# Patient Record
Sex: Female | Born: 1975 | Race: White | Hispanic: No | Smoking: Never smoker
Health system: Southern US, Community
[De-identification: ages and names within clinical notes are randomized; demographics above are authoritative.]

## PROBLEM LIST (undated history)

## (undated) DIAGNOSIS — K5909 Other constipation: Secondary | ICD-10-CM

## (undated) DIAGNOSIS — I471 Supraventricular tachycardia: Secondary | ICD-10-CM

## (undated) HISTORY — PX: TUBAL LIGATION: SHX77

## (undated) HISTORY — DX: Other constipation: K59.09

## (undated) HISTORY — DX: Supraventricular tachycardia: I47.1

---

## 1997-06-22 DIAGNOSIS — I471 Supraventricular tachycardia, unspecified: Secondary | ICD-10-CM

## 1997-06-22 HISTORY — DX: Supraventricular tachycardia: I47.1

## 1997-06-22 HISTORY — DX: Supraventricular tachycardia, unspecified: I47.10

## 1998-06-13 ENCOUNTER — Inpatient Hospital Stay (HOSPITAL_COMMUNITY): Admission: AD | Admit: 1998-06-13 | Discharge: 1998-06-13 | Payer: Self-pay | Admitting: Obstetrics & Gynecology

## 1998-06-25 ENCOUNTER — Encounter: Admission: RE | Admit: 1998-06-25 | Discharge: 1998-06-25 | Payer: Self-pay | Admitting: Obstetrics & Gynecology

## 1999-06-23 HISTORY — PX: TONSILLECTOMY: SHX5217

## 1999-10-08 ENCOUNTER — Emergency Department (HOSPITAL_COMMUNITY): Admission: EM | Admit: 1999-10-08 | Discharge: 1999-10-08 | Payer: Self-pay | Admitting: Emergency Medicine

## 1999-12-04 ENCOUNTER — Encounter: Payer: Self-pay | Admitting: *Deleted

## 1999-12-05 ENCOUNTER — Ambulatory Visit (HOSPITAL_COMMUNITY): Admission: RE | Admit: 1999-12-05 | Discharge: 1999-12-06 | Payer: Self-pay | Admitting: *Deleted

## 2001-08-05 ENCOUNTER — Emergency Department (HOSPITAL_COMMUNITY): Admission: EM | Admit: 2001-08-05 | Discharge: 2001-08-06 | Payer: Self-pay | Admitting: Emergency Medicine

## 2002-12-06 ENCOUNTER — Emergency Department (HOSPITAL_COMMUNITY): Admission: EM | Admit: 2002-12-06 | Discharge: 2002-12-06 | Payer: Self-pay | Admitting: Emergency Medicine

## 2012-11-28 ENCOUNTER — Ambulatory Visit: Payer: Self-pay | Admitting: Family Medicine

## 2012-12-06 ENCOUNTER — Ambulatory Visit: Payer: Self-pay | Admitting: Family Medicine

## 2013-02-01 ENCOUNTER — Ambulatory Visit (INDEPENDENT_AMBULATORY_CARE_PROVIDER_SITE_OTHER): Payer: 59 | Admitting: Physician Assistant

## 2013-02-01 ENCOUNTER — Encounter: Payer: Self-pay | Admitting: Physician Assistant

## 2013-02-01 VITALS — BP 104/72 | HR 76 | Temp 98.5°F | Resp 18 | Ht 68.5 in | Wt 146.0 lb

## 2013-02-01 DIAGNOSIS — K5909 Other constipation: Secondary | ICD-10-CM

## 2013-02-01 DIAGNOSIS — T7840XA Allergy, unspecified, initial encounter: Secondary | ICD-10-CM

## 2013-02-01 DIAGNOSIS — K900 Celiac disease: Secondary | ICD-10-CM

## 2013-02-01 DIAGNOSIS — N390 Urinary tract infection, site not specified: Secondary | ICD-10-CM

## 2013-02-01 DIAGNOSIS — K5900 Constipation, unspecified: Secondary | ICD-10-CM

## 2013-02-01 DIAGNOSIS — R3915 Urgency of urination: Secondary | ICD-10-CM

## 2013-02-01 LAB — URINALYSIS, ROUTINE W REFLEX MICROSCOPIC
Bilirubin Urine: NEGATIVE
Glucose, UA: NEGATIVE mg/dL
Hgb urine dipstick: NEGATIVE
Ketones, ur: NEGATIVE mg/dL
Nitrite: POSITIVE — AB
Protein, ur: NEGATIVE mg/dL
Specific Gravity, Urine: 1.02 (ref 1.005–1.030)
Urobilinogen, UA: 0.2 mg/dL (ref 0.0–1.0)
pH: 6.5 (ref 5.0–8.0)

## 2013-02-01 LAB — URINALYSIS, MICROSCOPIC ONLY
Casts: NONE SEEN
Crystals: NONE SEEN

## 2013-02-01 MED ORDER — LINACLOTIDE 145 MCG PO CAPS: 145.0000 ug | ORAL_CAPSULE | Freq: Every day | ORAL | Status: DC

## 2013-02-01 MED ORDER — NITROFURANTOIN MONOHYD MACRO 100 MG PO CAPS: 100.0000 mg | ORAL_CAPSULE | Freq: Two times a day (BID) | ORAL | Status: DC

## 2013-02-01 NOTE — Progress Notes (Signed)
Patient ID: Deanna Farrell MRN: [id number], DOB: 04-Nov-1975, 37 y.o. Date of Encounter: @DATE @  Chief Complaint:  Chief Complaint  Patient presents with  . new pt est care    also c/o UTI    HPI: 37 y.o. year old white female  presents as a new patient to our office to establish care here. She says that her mother is a patient here and her name is Marcelline Mates.  She has complaints of urinary urgency and malodorous urine. She said that she was having some dysuria but this is actually improved. She has seen no blood in his had no fevers or chills.  She reports a history of chronic constipation. She says that she tries to limit her laxative use she's afraid this will lead to intestinal problems.  As well she thinks that she definitetly has some type of gluten intolerance. She finds that when she eats bread this causes GI upset. At times when she has eaten a lot of bread she has even had some headache and joint pain.  She also reports that she knows she has some external hemorrhoids but says that these are not itchy or painful.  She reports her last physical was probably around 2007. As well she has had no GYN exam in years as well.   Past Medical History  Diagnosis Date  . SVT (supraventricular tachycardia) 1999  . Chronic constipation      Home Meds: See attached medication section for current medication list. Any medications entered into computer today will not appear on this note's list. The medications listed below were entered prior to today. No current outpatient prescriptions on file prior to visit.   No current facility-administered medications on file prior to visit.    Allergies: No Known Allergies  History   Social History  . Marital Status: Married    Spouse Name: N/A    Number of Children: N/A  . Years of Education: N/A   Occupational History  . [employer]    Social History Main Topics  . Smoking status: Never Smoker   . Smokeless tobacco: Never Used   . Alcohol Use: No  . Drug Use: No  . Sexual Activity: Not on file   Other Topics Concern  . Not on file   Social History Narrative   Single.    3 children. Ages 36, 36, 8.   Children with her full time.   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 Diabetes Mother   . Hypertension Father   . Hypertension Sister   . Diabetes Maternal Grandmother   . Cancer Maternal Grandmother 57    Lung Cancer--smoker  . Diabetes Paternal Grandmother   . Cancer Paternal Grandmother 87    Breast Cancer  . Stroke Paternal Grandmother   . Heart disease Paternal Grandmother      Review of Systems:  See HPI for pertinent ROS. All other ROS negative.    Physical Exam: Blood pressure 104/72, pulse 76, temperature 98.5 F (36.9 C), temperature source Oral, resp. rate 18, height 5' 8.5" (1.74 m), weight 146 lb (66.225 kg), last menstrual period 01/24/2013., Body mass index is 21.87 kg/(m^2). General: Well-nourished well-developed white female Appears in no acute distress. Neck: Supple. No thyromegaly. No lymphadenopathy. Lungs: Clear bilaterally to auscultation without wheezes, rales, or rhonchi. Breathing is unlabored. Heart: RRR with S1 S2. No murmurs, rubs, or gallops. Abdomen: Soft, non-tender, non-distended with normoactive bowel sounds.  No hepatomegaly. No rebound/guarding. No obvious abdominal masses. Musculoskeletal:  Strength and tone normal for age. Extremities/Skin: Warm and dry.  No edema.  Neuro: Alert and oriented X 3. Moves all extremities spontaneously. Gait is normal. CNII-XII grossly in tact. Psych:  Responds to questions appropriately with a normal affect.   Results for orders placed in visit on 02/01/13  URINALYSIS, ROUTINE W REFLEX MICROSCOPIC      Result Value Range   Color, Urine YELLOW  YELLOW   APPearance CLOUDY (*) CLEAR   Specific Gravity, Urine 1.020  1.005 - 1.030   pH 6.5  5.0 - 8.0   Glucose, UA NEG  NEG  mg/dL   Bilirubin Urine NEG  NEG   Ketones, ur NEG  NEG mg/dL   Hgb urine dipstick NEG  NEG   Protein, ur NEG  NEG mg/dL   Urobilinogen, UA 0.2  0.0 - 1.0 mg/dL   Nitrite POS (*) NEG   Leukocytes, UA TRACE (*) NEG  URINALYSIS, MICROSCOPIC ONLY      Result Value Range   Squamous Epithelial / LPF RARE  RARE   Crystals NONE SEEN  NONE SEEN   Casts NONE SEEN  NONE SEEN   WBC, UA 3-6 (*) <3 WBC/hpf   RBC / HPF 0-2  <3 RBC/hpf   Bacteria, UA MANY (*) RARE     ASSESSMENT AND PLAN:  37 y.o. year old female with  1. Urgency of urination - Urinalysis, Routine w reflex microscopic  2. UTI (urinary tract infection) - nitrofurantoin, macrocrystal-monohydrate, (MACROBID) 100 MG capsule; Take 1 capsule (100 mg total) by mouth 2 (two) times daily.  Dispense: 6 capsule; Refill: 0  3.  gluten sensitivity Will obtain Celiac Panel, CMET, TSH, CBC to further evaluate. - Celiac panel 10 - CBC with Differential - COMPLETE METABOLIC PANEL WITH GFR  4. Chronic constipation - Linaclotide (LINZESS) 145 MCG CAPS capsule; Take 1 capsule (145 mcg total) by mouth daily.  Dispense: 30 capsule; Refill: 3  She is to schedule a complete physical exam for an early morning appointment so she can come fasting and check fasting labs at the same time as the visits.  Murray Hodgkins [place], [place], [facility] 02/01/2013 3:25 PM

## 2013-02-02 LAB — CBC WITH DIFFERENTIAL/PLATELET
Basophils Absolute: 0.1 10*3/uL (ref 0.0–0.1)
Basophils Relative: 1 % (ref 0–1)
Eosinophils Absolute: 0.2 10*3/uL (ref 0.0–0.7)
Eosinophils Relative: 3 % (ref 0–5)
HCT: 40.3 % (ref 36.0–46.0)
Hemoglobin: 13.4 g/dL (ref 12.0–15.0)
Lymphocytes Relative: 22 % (ref 12–46)
Lymphs Abs: 2.1 10*3/uL (ref 0.7–4.0)
MCH: 26.9 pg (ref 26.0–34.0)
MCHC: 33.3 g/dL (ref 30.0–36.0)
MCV: 80.9 fL (ref 78.0–100.0)
Monocytes Absolute: 0.7 10*3/uL (ref 0.1–1.0)
Monocytes Relative: 7 % (ref 3–12)
Neutro Abs: 6.5 10*3/uL (ref 1.7–7.7)
Neutrophils Relative %: 67 % (ref 43–77)
Platelets: 208 10*3/uL (ref 150–400)
RBC: 4.98 MIL/uL (ref 3.87–5.11)
RDW: 13.9 % (ref 11.5–15.5)
WBC: 9.5 10*3/uL (ref 4.0–10.5)

## 2013-02-02 LAB — COMPLETE METABOLIC PANEL WITH GFR
ALT: 13 U/L (ref 0–35)
AST: 13 U/L (ref 0–37)
Albumin: 4.4 g/dL (ref 3.5–5.2)
Alkaline Phosphatase: 66 U/L (ref 39–117)
BUN: 12 mg/dL (ref 6–23)
CO2: 26 mEq/L (ref 19–32)
Calcium: 9.3 mg/dL (ref 8.4–10.5)
Chloride: 104 mEq/L (ref 96–112)
Creat: 0.77 mg/dL (ref 0.50–1.10)
GFR, Est African American: >89 mL/min
GFR, Est Non African American: >89 mL/min
Glucose, Bld: 76 mg/dL (ref 70–99)
Potassium: 3.9 mEq/L (ref 3.5–5.3)
Sodium: 137 mEq/L (ref 135–145)
Total Bilirubin: 0.3 mg/dL (ref 0.3–1.2)
Total Protein: 7.2 g/dL (ref 6.0–8.3)

## 2013-02-02 LAB — CELIAC PANEL 10
Endomysial Screen: NEGATIVE
Gliadin IgA: 2 U/mL (ref <20)
Gliadin IgG: 5.8 U/mL (ref <20)
IgA: 192 mg/dL (ref 69–380)
Tissue Transglut Ab: 8.7 U/mL (ref <20)
Tissue Transglutaminase Ab, IgA: 2.1 U/mL (ref <20)

## 2013-02-02 LAB — TSH: TSH: 2.833 u[IU]/mL (ref 0.350–4.500)

## 2013-03-09 ENCOUNTER — Encounter: Payer: Self-pay | Admitting: Physician Assistant

## 2013-03-09 ENCOUNTER — Ambulatory Visit (INDEPENDENT_AMBULATORY_CARE_PROVIDER_SITE_OTHER): Payer: 59 | Admitting: Physician Assistant

## 2013-03-09 VITALS — BP 100/70 | HR 82 | Temp 97.5°F | Resp 20 | Ht 67.0 in | Wt 144.0 lb

## 2013-03-09 DIAGNOSIS — K900 Celiac disease: Secondary | ICD-10-CM

## 2013-03-09 DIAGNOSIS — Z0000 Encounter for general adult medical examination without abnormal findings: Secondary | ICD-10-CM

## 2013-03-09 DIAGNOSIS — K5909 Other constipation: Secondary | ICD-10-CM

## 2013-03-09 DIAGNOSIS — K5900 Constipation, unspecified: Secondary | ICD-10-CM

## 2013-03-09 LAB — LIPID PANEL
Cholesterol: 157 mg/dL (ref 0–200)
HDL: 50 mg/dL (ref >39)
LDL Cholesterol: 95 mg/dL (ref 0–99)
Total CHOL/HDL Ratio: 3.1 Ratio
Triglycerides: 62 mg/dL (ref <150)
VLDL: 12 mg/dL (ref 0–40)

## 2013-03-09 NOTE — Progress Notes (Signed)
Patient ID: Deanna Farrell MRN: [id number], DOB: 07-28-1975, 37 y.o. Date of Encounter: 03/09/2013,   Chief Complaint: Physical (CPE)  HPI: 37 y.o. y/o white female  here for CPE.   I saw her as a new patient to our office to establish care here on 02/02/13.  At that time she reported a history of chronic constipation. She reported she tried to limit her laxative use because she was afraid that would lead to intestinal problems. At that visit I prescribed Linzess. She says that this definitely helps. However she reports that she has a hard time remembering to take it every single day. She is a single mother with 3 children ages 36, 22, and 32. The children are with her full time. She works from 7:30 AM to about 6 PM. She says that the older son helps with transporting the siblings in the afternoon. In the morning she has to get them all to school. She says that she keeps the medicine bottle in her purse and doesnot always remember to take it when she gets to work. She says that when she does take it routinely but it definitely does work well.  However she says that even with the linzess, she still feels that there is some type of problem. She says it feels as if there is some type of pouch where stool is getting stuck or some type of obstruction. She is still concerned about this.  Also at her visit with me on 02/02/13 she said that she felt that she had some type of gluten intolerance. She reported that when she ate bread this caused GI upset. At that office visit we obtained labs including a celiac panel and thyroid function CBC and CMET. All labs were normal including a celiac panel.  However she continues to report the same symptoms with GI upset anytime she eats bread products.  She has no other complaints. She has had no GYN exam in several years. However she does not want to do GYN exam today and says that she needs to hurry to get back to work.   Review of Systems: Consitutional: No fever,  chills, fatigue, night sweats, lymphadenopathy. No significant/unexplained weight changes. Eyes: No visual changes, eye redness, or discharge. ENT/Mouth: No ear pain, sore throat, nasal drainage, or sinus pain. Cardiovascular: No chest pressure,heaviness, tightness or squeezing, even with exertion. No increased shortness of breath or dyspnea on exertion.No palpitations, edema, orthopnea, PND. Respiratory: No cough, hemoptysis, SOB, or wheezing. Gastrointestinal: No anorexia, dysphagia, reflux, pain, nausea, vomiting, hematemesis, diarrhea,  BRBPR, or melena. Breast: No mass, nodules, bulging, or retraction. No skin changes or inflammation. No nipple discharge. No lymphadenopathy. Genitourinary: No dysuria, hematuria, incontinence, vaginal discharge, pruritis, burning, abnormal bleeding, or pain. Musculoskeletal: No decreased ROM, No joint pain or swelling. No significant pain in neck, back, or extremities. Skin: No rash, pruritis, or concerning lesions. Neurological: No headache, dizziness, syncope, seizures, tremors, memory loss, coordination problems, or paresthesias. Psychological: No anxiety, depression, hallucinations, SI/HI. Endocrine: No polydipsia, polyphagia, polyuria, or known diabetes.No increased fatigue. No palpitations/rapid heart rate. No significant/unexplained weight change. All other systems were reviewed and are otherwise negative.  Past Medical History  Diagnosis Date  . SVT (supraventricular tachycardia) 1999  . Chronic constipation      Past Surgical History  Procedure Laterality Date  . Tonsillectomy  2001    Home Meds:  Current Outpatient Prescriptions on File Prior to Visit  Medication Sig Dispense Refill  . Linaclotide (LINZESS) 145 MCG CAPS  capsule Take 1 capsule (145 mcg total) by mouth daily.  30 capsule  3   No current facility-administered medications on file prior to visit.    Allergies: No Known Allergies  History   Social History  . Marital  Status:  single     Spouse Name: N/A    Number of Children: N/A  . Years of Education: N/A   Occupational History  . [employer]    Social History Main Topics  . Smoking status: Never Smoker   . Smokeless tobacco: Never Used  . Alcohol Use: No  . Drug Use: No  . Sexual Activity: Not on file   Other Topics Concern  . Not on file   Social History Narrative   Single.    3 children. Ages 108, 36, 8.   Children with her full time.   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 Diabetes Mother   . Hypertension Father   . Hypertension Sister   . Diabetes Maternal Grandmother   . Cancer Maternal Grandmother 109    Lung Cancer--smoker  . Diabetes Paternal Grandmother   . Cancer Paternal Grandmother 69    Breast Cancer  . Stroke Paternal Grandmother   . Heart disease Paternal Grandmother     Physical Exam: Blood pressure 100/70, pulse 82, temperature 97.5 F (36.4 C), temperature source Oral, resp. rate 20, height 5\' 7"  (1.702 m), weight 144 lb (65.318 kg), last menstrual period 02/24/2013., Body mass index is 22.55 kg/(m^2). General: Well developed, well nourished, white female.  in no acute distress. HEENT: Normocephalic, atraumatic. Conjunctiva pink, sclera non-icteric. Pupils 2 mm constricting to 1 mm, round, regular, and equally reactive to light and accomodation. EOMI. Internal auditory canal clear. TMs with good cone of light and without pathology. Nasal mucosa pink. Nares are without discharge. No sinus tenderness. Oral mucosa pink.  Pharynx without exudate.   Neck: Supple. Trachea midline. No thyromegaly. Full ROM. No lymphadenopathy.No Carotid Bruits. Lungs: Clear to auscultation bilaterally without wheezes, rales, or rhonchi. Breathing is of normal effort and unlabored. Cardiovascular: RRR with S1 S2. No murmurs, rubs, or gallops. Distal pulses 2+ symmetrically. No carotid or abdominal bruits. Breast: Deferred  by pt today. Abdomen: Soft, non-tender, non-distended with normoactive bowel sounds. No hepatosplenomegaly or masses. No rebound/guarding. No CVA tenderness. No hernias.  Genitourinary:  Deferred by pt today Musculoskeletal: Full range of motion and 5/5 strength throughout. Without swelling, atrophy, tenderness, crepitus, or warmth. Extremities without clubbing, cyanosis, or edema. Calves supple. Skin: Warm and moist without erythema, ecchymosis, wounds, or rash. Neuro: A+Ox3. CN II-XII grossly intact. Moves all extremities spontaneously. Full sensation throughout. Normal gait. DTR 2+ throughout upper and lower extremities. Finger to nose intact. Psych:  Responds to questions appropriately with a normal affect.   Assessment/Plan:  37 y.o. y/o female here for CPE 1. Visit for preventive health examination 8/14 we already did a celiac panel, CBC, CMP T., TSH. All were normal. We'll check other labs now. - Lipid panel - Vit D  25 hydroxy (rtn osteoporosis monitoring)  2. Chronic constipation I recommended that she put her lens as with her hair brush her her toothbrush. Keep some small Dixie cups with it. Take it every morning when she brushes her hair brushes her teeth. Given her other symptoms it sounds as she may need colonoscopy. Discussed this with her and she is agreeable for me to proceed with referral to GI. - Ambulatory referral  to Gastroenterology  3. Transient gluten sensitivity Celiac panel was negative. However she clearly has GI symptoms anytime she eats these products. Continue to avoid these products and can further evaluate with.GI - Ambulatory referral to Gastroenterology   A. Screening Labs: See above  B. Pap: Patient says she needs to hurry to go back to work today. She will reschedule to come in to do GYN exam.   F. Immunizations:  Influenza: Discuss getting flu vaccine today. She defers. Tetanus: She thinks her tetanus is up to date defers getting this  today.  Signed, [address] [place], [place], [facility] 03/09/2013 9:08 AM

## 2013-03-10 ENCOUNTER — Encounter: Payer: Self-pay | Admitting: Gastroenterology

## 2013-03-10 LAB — VITAMIN D 25 HYDROXY (VIT D DEFICIENCY, FRACTURES): Vit D, 25-Hydroxy: 26 ng/mL — ABNORMAL LOW (ref 30–89)

## 2013-03-20 ENCOUNTER — Encounter: Payer: Self-pay | Admitting: Family Medicine

## 2013-04-03 ENCOUNTER — Telehealth: Payer: Self-pay | Admitting: Gastroenterology

## 2013-04-03 ENCOUNTER — Encounter: Payer: Self-pay | Admitting: Gastroenterology

## 2013-04-03 ENCOUNTER — Ambulatory Visit: Payer: Self-pay | Admitting: Gastroenterology

## 2013-04-03 NOTE — Telephone Encounter (Signed)
MAILED LETTER °

## 2013-04-03 NOTE — Telephone Encounter (Signed)
Pt was a no show

## 2013-04-04 ENCOUNTER — Encounter: Payer: Self-pay | Admitting: Gastroenterology

## 2013-04-08 ENCOUNTER — Encounter (HOSPITAL_COMMUNITY): Payer: Self-pay | Admitting: Emergency Medicine

## 2013-04-08 ENCOUNTER — Emergency Department (HOSPITAL_COMMUNITY)
Admission: EM | Admit: 2013-04-08 | Discharge: 2013-04-08 | Disposition: A | Discharge status: Home / Self Care | Payer: 59 | Attending: Emergency Medicine | Admitting: Emergency Medicine

## 2013-04-08 DIAGNOSIS — Z79899 Other long term (current) drug therapy: Secondary | ICD-10-CM | POA: Insufficient documentation

## 2013-04-08 DIAGNOSIS — R51 Headache: Secondary | ICD-10-CM | POA: Insufficient documentation

## 2013-04-08 DIAGNOSIS — K5900 Constipation, unspecified: Secondary | ICD-10-CM | POA: Insufficient documentation

## 2013-04-08 DIAGNOSIS — IMO0001 Reserved for inherently not codable concepts without codable children: Secondary | ICD-10-CM | POA: Insufficient documentation

## 2013-04-08 DIAGNOSIS — J020 Streptococcal pharyngitis: Secondary | ICD-10-CM | POA: Insufficient documentation

## 2013-04-08 DIAGNOSIS — R509 Fever, unspecified: Secondary | ICD-10-CM | POA: Insufficient documentation

## 2013-04-08 DIAGNOSIS — Z8679 Personal history of other diseases of the circulatory system: Secondary | ICD-10-CM | POA: Insufficient documentation

## 2013-04-08 LAB — RAPID STREP SCREEN (MED CTR MEBANE ONLY): Streptococcus, Group A Screen (Direct): POSITIVE — AB

## 2013-04-08 MED ORDER — IBUPROFEN 800 MG PO TABS
800.0000 mg | ORAL_TABLET | Freq: Once | ORAL | Status: AC
  Administered 2013-04-08: 800 mg via ORAL
  Filled 2013-04-08: qty 1

## 2013-04-08 MED ORDER — PENICILLIN V POTASSIUM 250 MG PO TABS: 250.0000 mg | ORAL_TABLET | Freq: Four times a day (QID) | ORAL | Status: AC

## 2013-04-08 NOTE — ED Notes (Signed)
Pt presents with sore throat, body aches, and fever since last night. NAD noted. Pt reports has a history of repeat strep throat.

## 2013-04-08 NOTE — ED Provider Notes (Signed)
Medical screening examination/treatment/procedure(s) were performed by non-physician practitioner and as supervising physician I was immediately available for consultation/collaboration. Kenli Waldo, MD, FACEP   Jedaiah Rathbun L Anastasios Melander, MD 04/08/13 1615 

## 2013-04-08 NOTE — ED Provider Notes (Signed)
CSN: [id number]     Arrival date & time 04/08/13  1243 History   First MD Initiated Contact with Patient 04/08/13 1252     Chief Complaint  Patient presents with  . Sore Throat   (Consider location/radiation/quality/duration/timing/severity/associated sxs/prior Treatment) Patient is a 37 y.o. female presenting with pharyngitis.  Sore Throat This is a new problem. The current episode started yesterday. The problem occurs constantly. The problem has been gradually worsening. Associated symptoms include chills, a fever, headaches, myalgias, a sore throat and swollen glands. Pertinent negatives include no abdominal pain, nausea, rash or vomiting. The symptoms are aggravated by eating and swallowing. She has tried acetaminophen for the symptoms. The treatment provided mild relief.   Deanna Farrell is a 36 y.o. female who presents to the ED with sore throat and fever that started last night. She has had strep in the past and this feels the same.   Past Medical History  Diagnosis Date  . SVT (supraventricular tachycardia) 1999  . Chronic constipation    Past Surgical History  Procedure Laterality Date  . Tonsillectomy  2001  . Tubal ligation     Family History  Problem Relation Age of Onset  . Diabetes Mother   . Hypertension Father   . Hypertension Sister   . Diabetes Maternal Grandmother   . Cancer Maternal Grandmother 58    Lung Cancer--smoker  . Diabetes Paternal Grandmother   . Cancer Paternal Grandmother 65    Breast Cancer  . Stroke Paternal Grandmother   . Heart disease Paternal Grandmother    History  Substance Use Topics  . Smoking status: Never Smoker   . Smokeless tobacco: Never Used  . Alcohol Use: No   OB History   Grav Para Term Preterm Abortions TAB SAB Ect Mult Living                 Review of Systems  Constitutional: Positive for fever and chills.  HENT: Positive for sore throat. Negative for trouble swallowing.   Eyes: Negative for visual disturbance.   Gastrointestinal: Negative for nausea, vomiting and abdominal pain.  Genitourinary: Negative for dysuria.  Musculoskeletal: Positive for myalgias.  Skin: Negative for rash.  Neurological: Positive for headaches.  Psychiatric/Behavioral: The patient is not nervous/anxious.     Allergies  Review of patient's allergies indicates no known allergies.  Home Medications   Current Outpatient Rx  Name  Route  Sig  Dispense  Refill  . Linaclotide (LINZESS) 145 MCG CAPS capsule   Oral   Take 1 capsule (145 mcg total) by mouth daily.   30 capsule   3    BP 111/66  Pulse 90  Temp(Src) 100 F (37.8 C) (Oral)  Ht 5\' 7"  (1.702 m)  Wt 145 lb (65.772 kg)  BMI 22.71 kg/m2  SpO2 100%  LMP 03/12/2013 Physical Exam  Nursing note and vitals reviewed. Constitutional: She is oriented to person, place, and time. She appears well-developed and well-nourished. No distress.  HENT:  Head: Normocephalic.  Mouth/Throat: Uvula is midline and mucous membranes are normal. Posterior oropharyngeal erythema present.  Eyes: EOM are normal.  Neck: Normal range of motion. Neck supple.  Cardiovascular: Normal rate and regular rhythm.   Pulmonary/Chest: Effort normal and breath sounds normal.  Abdominal: Soft. Bowel sounds are normal. There is no tenderness.  Musculoskeletal: Normal range of motion.  Lymphadenopathy:    She has cervical adenopathy.  Neurological: She is alert and oriented to person, place, and time. No cranial nerve deficit.  Skin: Skin is warm and dry.  Psychiatric: She has a normal mood and affect. Her behavior is normal.   Results for orders placed during the hospital encounter of 04/08/13 (from the past 24 hour(s))  RAPID STREP SCREEN     Status: Abnormal   Collection Time    04/08/13 12:59 PM      Result Value Range   Streptococcus, Group A Screen (Direct) POSITIVE (*) NEGATIVE    ED Course  Procedures  MDM  37 y.o. female with strep pharyngitis. Will treat with antibiotics  and she will follow up with her PCP or return here as needed.  Discussed with the patient and all questioned fully answered.   Medication List    TAKE these medications       penicillin v potassium 250 MG tablet  Commonly known as:  VEETID  Take 1 tablet (250 mg total) by mouth 4 (four) times daily.      ASK your doctor about these medications       Linaclotide 145 MCG Caps capsule  Commonly known as:  LINZESS  Take 1 capsule (145 mcg total) by mouth daily.           Janne Napoleon, NP 04/08/13 1322

## 2013-04-12 ENCOUNTER — Other Ambulatory Visit: Payer: 59 | Admitting: Physician Assistant

## 2013-04-24 ENCOUNTER — Ambulatory Visit (INDEPENDENT_AMBULATORY_CARE_PROVIDER_SITE_OTHER): Payer: 59 | Admitting: Gastroenterology

## 2013-04-24 ENCOUNTER — Other Ambulatory Visit: Payer: Self-pay | Admitting: Gastroenterology

## 2013-04-24 ENCOUNTER — Encounter: Payer: Self-pay | Admitting: Gastroenterology

## 2013-04-24 VITALS — BP 114/71 | HR 70 | Temp 97.3°F | Wt 148.2 lb

## 2013-04-24 DIAGNOSIS — K5900 Constipation, unspecified: Secondary | ICD-10-CM

## 2013-04-24 DIAGNOSIS — K5909 Other constipation: Secondary | ICD-10-CM

## 2013-04-24 MED ORDER — PEG 3350-KCL-NA BICARB-NACL 420 G PO SOLR: 4000.0000 mL | ORAL | Status: DC

## 2013-04-24 MED ORDER — LINACLOTIDE 290 MCG PO CAPS: 290.0000 ug | ORAL_CAPSULE | Freq: Every day | ORAL | Status: DC

## 2013-04-24 NOTE — Progress Notes (Signed)
Referring Provider: Deon Pilling Primary Care Physician:  Leo Grosser, MD Primary Gastroenterologist:  Dr. Darrick Penna   Chief Complaint  Patient presents with  . Constipation  . Hemorrhoids    HPI:   Deanna Farrell presents today at the request of Allayne Butcher, PA, secondary to chronic constipation. Negative celiac serologies. Bread worsens constipation. Feels like there is an area in her digestive tract where it feels like things "stop moving". Serious hemorrhoid issues. Worried about internal hemorrhoids, diverticulosis. Will have urge to have a BM but non-productive. Constipation since having children. Chronic. No rectal bleeding. Worsening over time. Immediately feels bloated with bread. Feels joint pain, headaches, achy feeling coming on. Area lower abdomen that feels painful/pressure, but the stool will not pass. Having difficulty with diet modification due to strenuous work-life. Off of Linzess now, copay 30 $. Cost is an issue at times. Has to manually reduce hemorrhoids. Rectal discomfort. No other concerning GI symptoms.   Past Medical History  Diagnosis Date  . SVT (supraventricular tachycardia) 1999  . Chronic constipation     Past Surgical History  Procedure Laterality Date  . Tonsillectomy  2001  . Tubal ligation      Current Outpatient Prescriptions  Medication Sig Dispense Refill  . Linaclotide (LINZESS) 290 MCG CAPS capsule Take 1 capsule (290 mcg total) by mouth daily.  30 capsule  3  . polyethylene glycol-electrolytes (TRILYTE) 420 G solution Take 4,000 mLs by mouth as directed.  4000 mL  0   No current facility-administered medications for this visit.    Allergies as of 04/24/2013  . (No Known Allergies)    Family History  Problem Relation Age of Onset  . Diabetes Mother   . Hypertension Father   . Hypertension Sister   . Diabetes Maternal Grandmother   . Cancer Maternal Grandmother 52    Lung Cancer--smoker  . Diabetes Paternal Grandmother    . Cancer Paternal Grandmother 23    Breast Cancer  . Stroke Paternal Grandmother   . Heart disease Paternal Grandmother   . Colon cancer Neg Hx     History   Social History  . Marital Status: Divorced    Spouse Name: N/A    Number of Children: N/A  . Years of Education: N/A   Occupational History  . [employer]    Social History Main Topics  . Smoking status: Never Smoker   . Smokeless tobacco: Never Used  . Alcohol Use: No  . Drug Use: No  . Sexual Activity: Yes    Birth Control/ Protection: Surgical   Other Topics Concern  . Not on file   Social History Narrative   Single.    3 children. Ages 35, 36,35 . 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    Children with her full time.   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   Physical Exam: BP 114/71  Pulse 70  Temp(Src) 97.3 F (36.3 C) (Oral)  Wt 148 lb 3.2 oz (67.223 kg)  LMP 04/24/2013 General:   Alert and oriented. Well-developed, well-nourished, pleasant and cooperative. Head:  Normocephalic and atraumatic. Eyes:  Conjunctiva pink, sclera clear, no icterus.   Conjunctiva pink. Ears:  Normal auditory acuity. Nose:  No deformity, discharge,  or lesions. Mouth:  No deformity or lesions, mucosa pink and moist.  Neck:  Supple, without mass or thyromegaly. Lungs:  Clear to auscultation bilaterally, without wheezing, rales, or rhonchi.  Heart:  S1, S2 present without murmurs  noted.  Abdomen:  +BS, soft, non-tender and non-distended. Without mass or HSM. No rebound or guarding. No hernias noted. Rectal:  Deferred  Msk:  Symmetrical without gross deformities. Normal posture. Extremities:  Without clubbing or edema. Neurologic:  Alert and  oriented x4;  grossly normal neurologically. Skin:  Intact, warm and dry without significant lesions or rashes Cervical Nodes:  No significant cervical adenopathy. Psych:  Alert and cooperative. Normal mood and affect.

## 2013-04-24 NOTE — Patient Instructions (Signed)
Start taking Linzess 290 mcg once each morning on an empty stomach. We have provided a voucher for you to get a free month supply.  We have scheduled you for a colonoscopy with Dr. Darrick Penna in the near future.

## 2013-04-26 NOTE — Assessment & Plan Note (Signed)
37 year old female with chronic constipation now worsening, with urges at time to defecate without production. Notes difficulty passing stool, with what sounds like possible element of pelvic floor dysfunction. No rectal bleeding noted. Increase Linzess to 290 mcg and proceed with colonoscopy with Dr. Darrick Penna due to worsening constipation despite medication. Risks and benefits discussed, with stated understanding.

## 2013-05-01 NOTE — Progress Notes (Signed)
cc'd to pcp 

## 2013-05-11 ENCOUNTER — Other Ambulatory Visit: Payer: 59 | Admitting: Physician Assistant

## 2013-05-17 ENCOUNTER — Encounter (HOSPITAL_COMMUNITY): Payer: Self-pay

## 2013-05-29 ENCOUNTER — Encounter (HOSPITAL_COMMUNITY): Payer: Self-pay | Admitting: *Deleted

## 2013-05-29 ENCOUNTER — Other Ambulatory Visit: Payer: Self-pay | Admitting: Gastroenterology

## 2013-05-29 ENCOUNTER — Ambulatory Visit (HOSPITAL_COMMUNITY)
Admission: RE | Admit: 2013-05-29 | Discharge: 2013-05-29 | Disposition: A | Discharge status: Home / Self Care | Payer: 59 | Source: Ambulatory Visit | Attending: Gastroenterology | Admitting: Gastroenterology

## 2013-05-29 ENCOUNTER — Encounter (HOSPITAL_COMMUNITY)
Admission: RE | Disposition: A | Discharge status: Home / Self Care | Payer: Self-pay | Source: Ambulatory Visit | Attending: Gastroenterology

## 2013-05-29 DIAGNOSIS — N816 Rectocele: Secondary | ICD-10-CM

## 2013-05-29 DIAGNOSIS — K644 Residual hemorrhoidal skin tags: Secondary | ICD-10-CM

## 2013-05-29 DIAGNOSIS — K648 Other hemorrhoids: Secondary | ICD-10-CM

## 2013-05-29 DIAGNOSIS — M6289 Other specified disorders of muscle: Secondary | ICD-10-CM

## 2013-05-29 DIAGNOSIS — R198 Other specified symptoms and signs involving the digestive system and abdomen: Secondary | ICD-10-CM | POA: Insufficient documentation

## 2013-05-29 DIAGNOSIS — Z01419 Encounter for gynecological examination (general) (routine) without abnormal findings: Secondary | ICD-10-CM

## 2013-05-29 DIAGNOSIS — K5900 Constipation, unspecified: Secondary | ICD-10-CM

## 2013-05-29 HISTORY — PX: COLONOSCOPY: SHX5424

## 2013-05-29 SURGERY — COLONOSCOPY
Anesthesia: Moderate Sedation

## 2013-05-29 MED ORDER — SIMETHICONE 40 MG/0.6ML PO SUSP
ORAL | Status: DC | PRN
  Administered 2013-05-29: 09:00:00

## 2013-05-29 MED ORDER — MIDAZOLAM HCL 5 MG/5ML IJ SOLN
INTRAMUSCULAR | Status: DC | PRN
  Administered 2013-05-29: 1 mg via INTRAVENOUS
  Administered 2013-05-29 (×3): 2 mg via INTRAVENOUS

## 2013-05-29 MED ORDER — MEPERIDINE HCL 100 MG/ML IJ SOLN
INTRAMUSCULAR | Status: AC
  Filled 2013-05-29: qty 2

## 2013-05-29 MED ORDER — SODIUM CHLORIDE 0.9 % IV SOLN
INTRAVENOUS | Status: DC
  Administered 2013-05-29: 08:00:00 via INTRAVENOUS

## 2013-05-29 MED ORDER — MIDAZOLAM HCL 5 MG/5ML IJ SOLN
INTRAMUSCULAR | Status: AC
  Filled 2013-05-29: qty 10

## 2013-05-29 MED ORDER — MEPERIDINE HCL 100 MG/ML IJ SOLN
INTRAMUSCULAR | Status: DC | PRN
  Administered 2013-05-29 (×3): 25 mg via INTRAVENOUS

## 2013-05-29 NOTE — Op Note (Signed)
[facility] [address] [place], [address]   COLONOSCOPY PROCEDURE REPORT  PATIENT: Deanna Farrell, Deanna Farrell  MR#: [id number] BIRTHDATE: 11/26/1975 , 37  yrs. old GENDER: Female ENDOSCOPIST: Jonette Eva, MD REFERRED XB:JYNWGN Tanya Nones, M.D. PROCEDURE DATE:  05/29/2013 PROCEDURE:   Colonoscopy, diagnostic INDICATIONS:Change in bowel habits. MEDICATIONS: Demerol 75 mg IV and Versed 7 mg IV  DESCRIPTION OF PROCEDURE:    Physical exam was performed.  Informed consent was obtained from the patient after explaining the benefits, risks, and alternatives to procedure.  The patient was connected to monitor and placed in left lateral position. Continuous oxygen was provided by nasal cannula and IV medicine administered through an indwelling cannula.  After administration of sedation and rectal exam, the patients rectum was intubated and the EC-[id number] ([id number])  colonoscope was advanced under direct visualization to the ileum.  The scope was removed slowly by carefully examining the color, texture, anatomy, and integrity mucosa on the way out.  The patient was recovered in endoscopy and discharged home in satisfactory condition.    COLON FINDINGS: The mucosa appeared normal in the terminal ileum.  , The colon was redundant.  Manual abdominal counter-pressure was used to reach the cecum.  The patient was moved on to their back to reach the cecum, The colon mucosa was otherwise normal.  , and Moderate sized internal and external hemorrhoids were found.  PREP QUALITY: good.  CECAL W/D TIME: 10 minutes     COMPLICATIONS: None  ENDOSCOPIC IMPRESSION: 1.   Normal mucosa in the terminal ileum 2.   The colon was redundant 3.   Moderate sized internal and external hemorrhoids  RECOMMENDATIONS: CONTINUE LINZESS. SEE [facility] TO EVALUATE FOR RECTOCELE/PAP/PELVIC EXAM. DRINK WATER TO KEEP YOUR URINE LIGHT YELLOW. FOLLOW A HIGH FIBER DIET.  AVOID ITEMS THAT CAUSE  BLOATING. USE PREPARATION H 2 TO 4 TIMES A DAY AS NEEDED FOR RECTAL PRESSURE/PAIN/BLEEDING. OPV IN APR 2014.  Next colonoscopy AT AGE 35.       _______________________________ eSignedJonette Eva, MD 05/29/2013 9:22 AM

## 2013-05-29 NOTE — Progress Notes (Signed)
Referral has been made to [facility] 

## 2013-05-29 NOTE — Progress Notes (Signed)
REFER TO [facility] FOR CONSTIPATION-EVALUATE FOR PELVIC FLOOR DYSFUNCTION/RECTOCELE/PAP/PELVIC EXAM.

## 2013-05-29 NOTE — H&P (Signed)
  Primary Care Physician:  Leo Grosser, MD Primary Gastroenterologist:  Dr. Darrick Penna  Pre-Procedure History & Physical: HPI:  Deanna Farrell is a 37 y.o. female here for CHANGE IN BOWEL HABITs.Marland Kitchen   Past Medical History  Diagnosis Date  . SVT (supraventricular tachycardia) 1999  . Chronic constipation     Past Surgical History  Procedure Laterality Date  . Tonsillectomy  2001  . Tubal ligation      Prior to Admission medications   Medication Sig Start Date End Date Taking? Authorizing Provider  Linaclotide (LINZESS) 290 MCG CAPS capsule Take 1 capsule (290 mcg total) by mouth daily. 04/24/13  Yes Nira Retort, NP    Allergies as of 04/24/2013  . (No Known Allergies)    Family History  Problem Relation Age of Onset  . Diabetes Mother   . Hypertension Father   . Hypertension Sister   . Diabetes Maternal Grandmother   . Cancer Maternal Grandmother 48    Lung Cancer--smoker  . Diabetes Paternal Grandmother   . Cancer Paternal Grandmother 48    Breast Cancer  . Stroke Paternal Grandmother   . Heart disease Paternal Grandmother   . Colon cancer Neg Hx     History   Social History  . Marital Status: Divorced    Spouse Name: N/A    Number of Children: N/A  . Years of Education: N/A   Occupational History  . [employer]    Social History Main Topics  . Smoking status: Never Smoker   . Smokeless tobacco: Never Used  . Alcohol Use: No  . Drug Use: No  . Sexual Activity: Yes    Birth Control/ Protection: Surgical   Other Topics Concern  . Not on file   Social History Narrative   Single.    3 children. Ages 51, 74,43 . 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    Children with her full time.   Shipping/Receiving Clerk at [employer]    Review of Systems: See HPI, otherwise negative ROS   Physical Exam: BP 117/67  Pulse 73  Temp(Src) 99 F (37.2 C) (Oral)  Resp 18  Ht 5\' 7"  (1.702 m)  Wt 148 lb (67.132 kg)  BMI 23.17 kg/m2  SpO2 100%  LMP  05/11/2013 General:   Alert,  pleasant and cooperative in NAD Head:  Normocephalic and atraumatic. Neck:  Supple; Lungs:  Clear throughout to auscultation.    Heart:  Regular rate and rhythm. Abdomen:  Soft, nontender and nondistended. Normal bowel sounds, without guarding, and without rebound.   Neurologic:  Alert and  oriented x4;  grossly normal neurologically.  Impression/Plan:     Change in bowel habits  PLAN:  1. TCS TODAY

## 2013-06-01 ENCOUNTER — Encounter (HOSPITAL_COMMUNITY): Payer: Self-pay | Admitting: Gastroenterology

## 2013-06-06 ENCOUNTER — Encounter: Payer: 59 | Admitting: Women's Health

## 2013-06-07 ENCOUNTER — Ambulatory Visit (INDEPENDENT_AMBULATORY_CARE_PROVIDER_SITE_OTHER): Payer: 59 | Admitting: Physician Assistant

## 2013-06-07 ENCOUNTER — Encounter: Payer: Self-pay | Admitting: Physician Assistant

## 2013-06-07 VITALS — BP 100/60 | HR 76 | Temp 99.3°F | Resp 12 | Ht 67.0 in | Wt 147.0 lb

## 2013-06-07 DIAGNOSIS — N6320 Unspecified lump in the left breast, unspecified quadrant: Secondary | ICD-10-CM

## 2013-06-07 DIAGNOSIS — Z01419 Encounter for gynecological examination (general) (routine) without abnormal findings: Secondary | ICD-10-CM

## 2013-06-07 DIAGNOSIS — N63 Unspecified lump in unspecified breast: Secondary | ICD-10-CM

## 2013-06-07 DIAGNOSIS — Z124 Encounter for screening for malignant neoplasm of cervix: Secondary | ICD-10-CM

## 2013-06-07 DIAGNOSIS — Z1239 Encounter for other screening for malignant neoplasm of breast: Secondary | ICD-10-CM

## 2013-06-07 NOTE — Progress Notes (Signed)
    Patient ID: Deanna Farrell MRN: [id number], DOB: 26-Jun-1975, 37 y.o. Date of Encounter: 06/07/2013, 5:04 PM    Chief Complaint:  Chief Complaint  Patient presents with  . Gynecologic Exam     HPI: 37 y.o. year old white female recently came in for complete physical exam but was unable to do her pelvic exam that day secondary to her menses. Return today to do breast exam and pelvic exam. She says that her last pelvic exam was over 5 years ago.  she reports  Pap smears have been normal in the past. She says that her menstrual cycle is regular and has no abnormalities with that. She has 3 children ages 36, 23, 53 years old.  She does note that years ago when [place] she had mammogram and ultrasound secondary to mass in the left breast. Says that she was supposed to have followed up but has not. This has been  years ago.     Home Meds: See attached medication section for any medications that were entered at today's visit. The computer does not put those onto this list.The following list is a list of meds entered prior to today's visit.   Current Outpatient Prescriptions on File Prior to Visit  Medication Sig Dispense Refill  . Linaclotide (LINZESS) 290 MCG CAPS capsule Take 1 capsule (290 mcg total) by mouth daily.  30 capsule  3   No current facility-administered medications on file prior to visit.    Allergies: No Known Allergies    Review of Systems: See HPI for pertinent ROS. All other ROS negative.    Physical Exam: Blood pressure 100/60, pulse 76, temperature 99.3 F (37.4 C), temperature source Oral, resp. rate 12, height 5\' 7"  (1.702 m), weight 147 lb (66.679 kg), last menstrual period 05/11/2013., Body mass index is 23.02 kg/(m^2). General: WNWD WF. Appears in no acute distress. Neck: Supple. No thyromegaly. No lymphadenopathy. Lungs: Clear bilaterally to auscultation without wheezes, rales, or rhonchi. Breathing is unlabored. Heart: Regular rhythm. No  murmurs, rubs, or gallops. Breast exam: Right breast exam is normal. Left breast: At the 4:00 to 5:00 position, there is an approximate 1 cm firm mass. Remainder of the left breast is normal. No abnormalities of the skin. Extremities/Skin: Warm and dry.  No rashes or suspicious lesions. Neuro: Alert and oriented X 3. Moves all extremities spontaneously. Gait is normal. CNII-XII grossly in tact. Psych:  Responds to questions appropriately with a normal affect. Pelvic exam:  External genitalia normal. Vaginal mucosa normal. Cervix normal. Bimanual exam is normal with normal uterus. No adnexal mass.      ASSESSMENT AND PLAN:  37 y.o. year old female with  1. Encounter for cervical Pap smear with pelvic exam - PAP, Thin Prep w/HPV rflx HPV Type 16/18  2. Screening breast examination, BSE discussion  3. Left breast mass - MM Digital Diagnostic Unilat L; Future   Signed, [address] [place], [place], [facility] 06/07/2013 5:04 PM

## 2013-06-09 ENCOUNTER — Other Ambulatory Visit: Payer: Self-pay | Admitting: Family Medicine

## 2013-06-09 DIAGNOSIS — N6320 Unspecified lump in the left breast, unspecified quadrant: Secondary | ICD-10-CM

## 2013-06-09 LAB — PAP, THIN PREP W/HPV RFLX HPV TYPE 16/18

## 2013-06-16 ENCOUNTER — Telehealth: Payer: Self-pay | Admitting: Family Medicine

## 2013-06-16 DIAGNOSIS — IMO0002 Reserved for concepts with insufficient information to code with codable children: Secondary | ICD-10-CM

## 2013-06-16 NOTE — Telephone Encounter (Signed)
Spoke to patient.  Aware of PAP results.  Gyn referral initiated.  Want to see [facility] in [place]

## 2013-06-16 NOTE — Telephone Encounter (Signed)
Message copied by Donne Anon on Fri Jun 16, 2013 11:48 AM ------      Message from: Allayne Butcher      Created: Mon Jun 12, 2013  5:12 PM       Tell patient that her Pap smear shows some abnormal cells which requires followup with the gynecologist.      Find out if she has seen a specific gynecologist before or if she  has any preference of which gynecologist she sees. Then please place the order. ------

## 2013-06-28 ENCOUNTER — Ambulatory Visit (HOSPITAL_COMMUNITY)
Admission: RE | Admit: 2013-06-28 | Discharge: 2013-06-28 | Disposition: A | Discharge status: Home / Self Care | Payer: 59 | Source: Ambulatory Visit | Attending: Physician Assistant | Admitting: Physician Assistant

## 2013-06-28 ENCOUNTER — Other Ambulatory Visit: Payer: Self-pay | Admitting: Physician Assistant

## 2013-06-28 DIAGNOSIS — N6320 Unspecified lump in the left breast, unspecified quadrant: Secondary | ICD-10-CM

## 2013-06-28 DIAGNOSIS — N63 Unspecified lump in unspecified breast: Secondary | ICD-10-CM | POA: Insufficient documentation

## 2013-06-28 DIAGNOSIS — N6009 Solitary cyst of unspecified breast: Secondary | ICD-10-CM | POA: Insufficient documentation

## 2013-08-15 ENCOUNTER — Encounter: Payer: 59 | Admitting: Obstetrics & Gynecology

## 2013-08-21 ENCOUNTER — Telehealth: Payer: Self-pay

## 2013-08-21 NOTE — Telephone Encounter (Signed)
Received fax from pharmacy- pt needs linzess sent to the phamacy but needs it in 90 day supply for the insurance. Pt uses [facility] in [place]. SLF pt.

## 2013-08-22 ENCOUNTER — Encounter: Payer: Self-pay | Admitting: Obstetrics & Gynecology

## 2013-08-22 ENCOUNTER — Ambulatory Visit (INDEPENDENT_AMBULATORY_CARE_PROVIDER_SITE_OTHER): Payer: 59 | Admitting: Obstetrics & Gynecology

## 2013-08-22 ENCOUNTER — Other Ambulatory Visit: Payer: Self-pay | Admitting: Obstetrics & Gynecology

## 2013-08-22 ENCOUNTER — Encounter (INDEPENDENT_AMBULATORY_CARE_PROVIDER_SITE_OTHER): Payer: Self-pay

## 2013-08-22 VITALS — BP 100/60 | Ht 67.0 in | Wt 150.0 lb

## 2013-08-22 DIAGNOSIS — R87619 Unspecified abnormal cytological findings in specimens from cervix uteri: Secondary | ICD-10-CM

## 2013-08-22 DIAGNOSIS — N870 Mild cervical dysplasia: Secondary | ICD-10-CM

## 2013-08-22 MED ORDER — LINACLOTIDE 290 MCG PO CAPS: 290.0000 ug | ORAL_CAPSULE | Freq: Every day | ORAL | Status: DC

## 2013-08-22 NOTE — Progress Notes (Signed)
Patient ID: Deanna AsalSharon Farrell, female   DOB: 1975/07/02, 38 y.o.   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

## 2013-08-22 NOTE — Telephone Encounter (Signed)
Completed.

## 2013-08-29 ENCOUNTER — Ambulatory Visit (INDEPENDENT_AMBULATORY_CARE_PROVIDER_SITE_OTHER): Payer: 59 | Admitting: Obstetrics & Gynecology

## 2013-08-29 ENCOUNTER — Encounter: Payer: Self-pay | Admitting: Obstetrics & Gynecology

## 2013-08-29 VITALS — BP 100/80 | Wt 150.0 lb

## 2013-08-29 DIAGNOSIS — N870 Mild cervical dysplasia: Secondary | ICD-10-CM

## 2013-08-29 NOTE — Progress Notes (Signed)
Patient ID: Deanna AsalSharon Farrell, female   DOB: 06-Dec-1975, 38 y.o.   MRN: [id number] Pathology  LSIL  Follow up in 6 months  Repeat colposcopy then  Past Medical History  Diagnosis Date  . SVT (supraventricular tachycardia) 1999  . Chronic constipation     Past Surgical History  Procedure Laterality Date  . Tonsillectomy  2001  . Tubal ligation    . Colonoscopy N/A 05/29/2013    Procedure: COLONOSCOPY;  Surgeon: West BaliSandi L Fields, MD;  Location: [facility];  Service: Endoscopy;  Laterality: N/A;  8:30    OB History   Grav Para Term Preterm Abortions TAB SAB Ect Mult Living                  No Known Allergies  History   Social History  . Marital Status: Divorced    Spouse Name: N/A    Number of Children: N/A  . Years of Education: N/A   Occupational History  . [employer]    Social History Main Topics  . Smoking status: Never Smoker   . Smokeless tobacco: Never Used  . Alcohol Use: No  . Drug Use: No  . Sexual Activity: Yes    Birth Control/ Protection: Surgical   Other Topics Concern  . None   Social History Narrative   Single.    3 children. Ages 4120, 7911,629 . 364 year old autistic.    Children with her full time.   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 Diabetes Mother   . Hypertension Father   . Hypertension Sister   . Diabetes Maternal Grandmother   . Cancer Maternal Grandmother [id number]    Lung Cancer--smoker  . Diabetes Paternal Grandmother   . Cancer Paternal Grandmother [id number]    Breast Cancer  . Stroke Paternal Grandmother   . Heart disease Paternal Grandmother   . Colon cancer Neg Hx

## 2013-09-07 ENCOUNTER — Telehealth: Payer: Self-pay

## 2013-09-07 NOTE — Telephone Encounter (Signed)
Pt called and said she is having some problems with hemorrhoids. No bleeding, they are just in the way. She would like an appt at 4:00 pm one day if possible, so she would not have to miss more work. She said it would not matter if it is a week away or a month away, she has put up with them for sometime. She just needs to not have to miss work. Please advise!

## 2013-09-08 NOTE — Telephone Encounter (Signed)
PLEASE CALL PT. PLACE ON SCHEDULE FOR MAR 24 AT 4 PM FOR [facility] BANDING.

## 2013-09-11 NOTE — Telephone Encounter (Signed)
I called the pt about scheduling her for tomorrow  09/12/13 at 4:00 with SF pt said it is last minute on this appointment, pt said if not a week she would need at least a 2 or 3 day notice. Please advise

## 2013-09-11 NOTE — Telephone Encounter (Signed)
Routing to [place] to reschedule.

## 2013-09-11 NOTE — Telephone Encounter (Signed)
RSC FOR MAR 31 AT 4 PM.

## 2013-09-11 NOTE — Telephone Encounter (Signed)
Routing to Dr. Fields to advise! 

## 2013-09-12 NOTE — Telephone Encounter (Signed)
I called patient to offer her a [facility] banding appt with SF on March 31 at 4. She said if that was the best we could do that she would try to be here that day. Looking at her notes we had offered her appt for 3/24 at 4 but she didn't accept it.

## 2013-09-12 NOTE — Telephone Encounter (Signed)
REVIEWED.  

## 2013-09-12 NOTE — Telephone Encounter (Signed)
Routing to Dr. Fields.  

## 2013-09-19 ENCOUNTER — Encounter: Payer: 59 | Admitting: Gastroenterology

## 2013-09-20 ENCOUNTER — Ambulatory Visit: Payer: 59 | Admitting: Gastroenterology

## 2013-09-26 ENCOUNTER — Telehealth: Payer: Self-pay | Admitting: Gastroenterology

## 2013-09-26 ENCOUNTER — Ambulatory Visit: Payer: 59 | Admitting: Gastroenterology

## 2013-09-26 ENCOUNTER — Encounter: Payer: Self-pay | Admitting: Gastroenterology

## 2013-09-26 NOTE — Telephone Encounter (Signed)
Pt was a no show

## 2013-09-26 NOTE — Telephone Encounter (Signed)
Mailed letter °

## 2014-01-30 IMAGING — US US BREAST LTD UNI LEFT INC AXILLA
1 series · 13 of 13 positions shown · non-contrast
Comparison: None.

CLINICAL DATA: Palpable left breast. History fibrocystic breast
disease. There is no family history of breast cancer.

EXAM:
DIGITAL DIAGNOSTIC  bilateral MAMMOGRAM WITH CAD
ULTRASOUND bilateral BREAST

[Series 1: us breast ltd uni left inc axilla · 0.07mm/px · 13 of 13 slices shown]
[im 1/13]
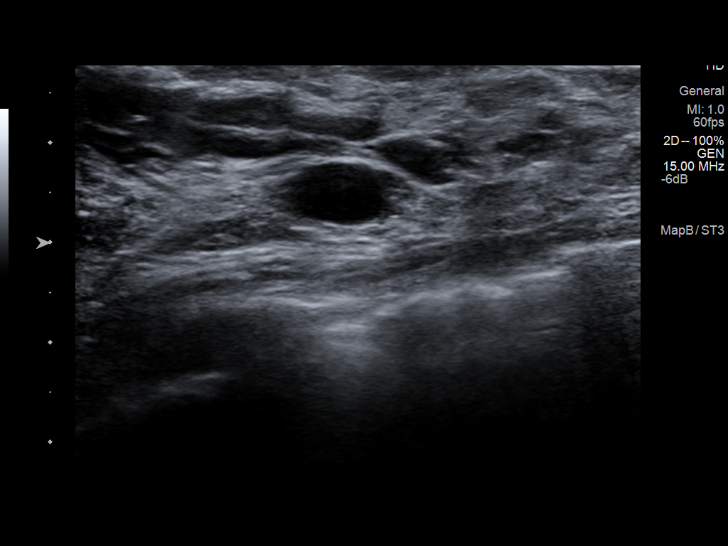
[im 2/13]
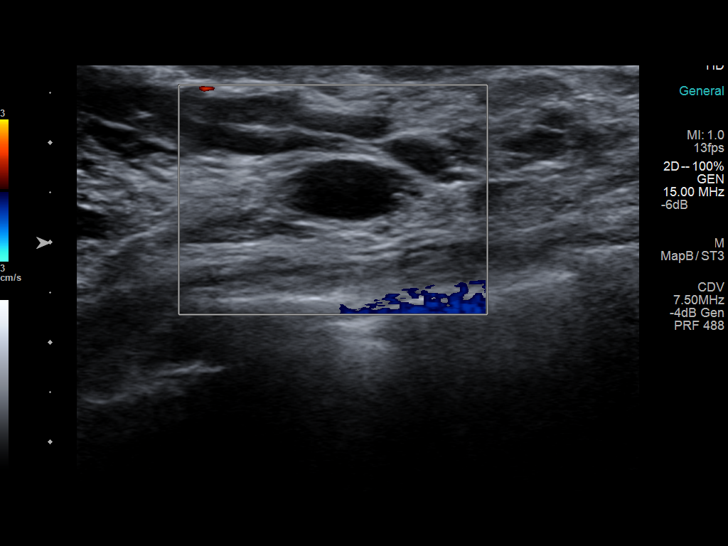
[im 3/13]
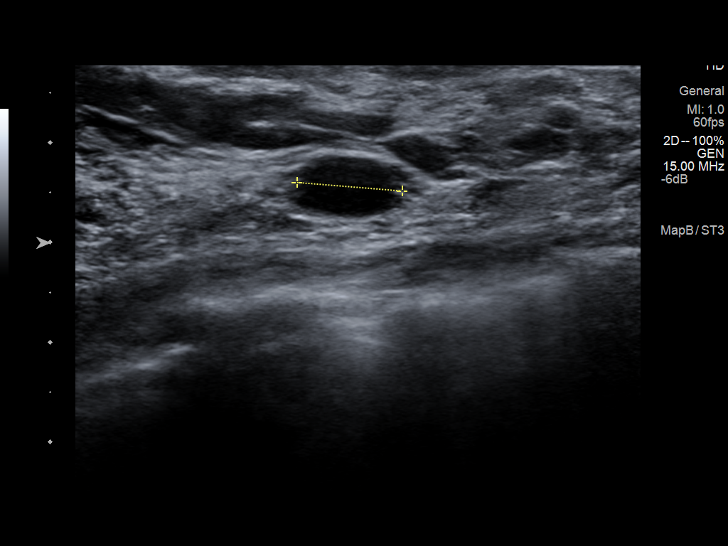
[im 4/13]
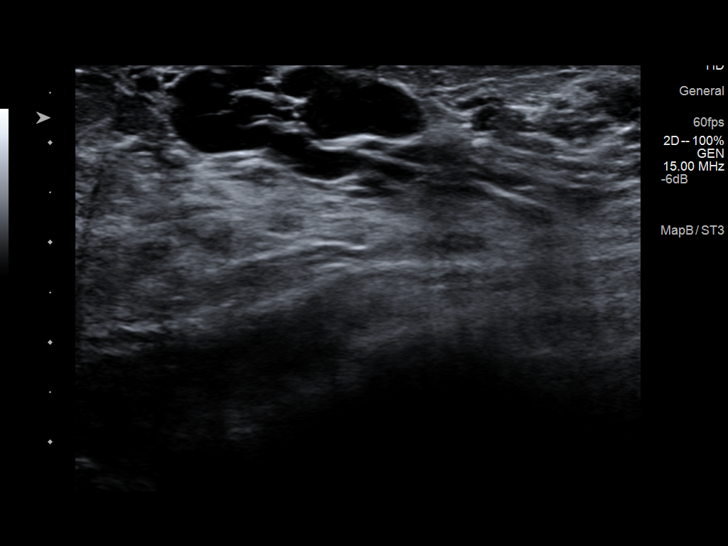
[im 5/13]
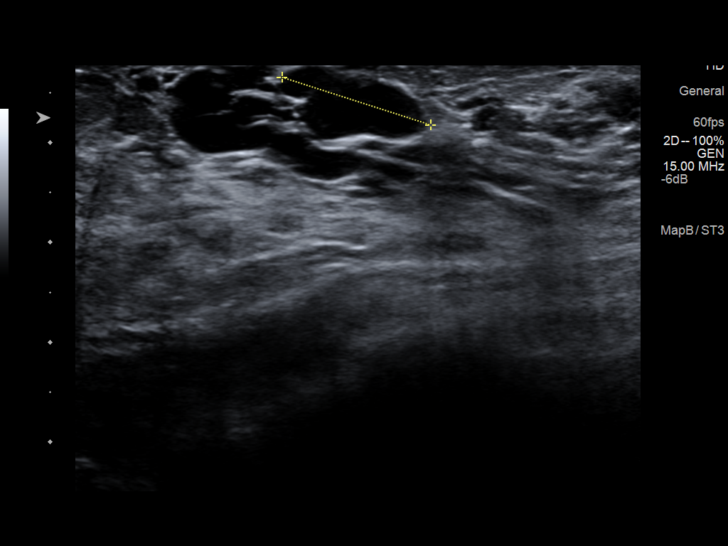
[im 6/13]
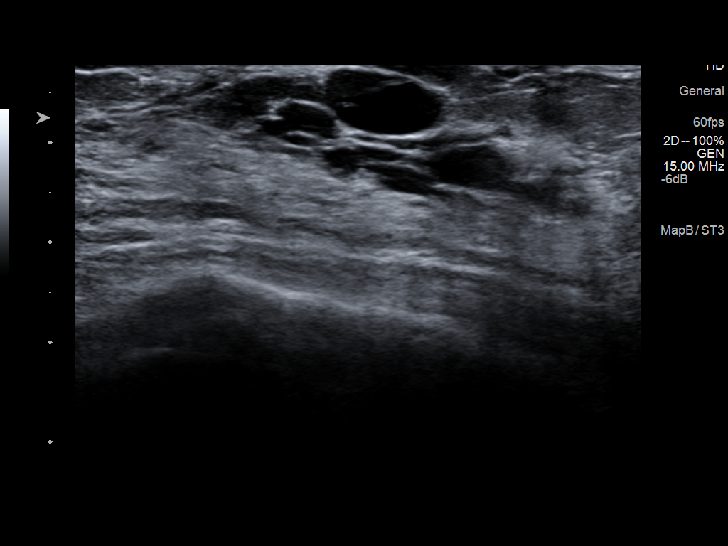
[im 7/13]
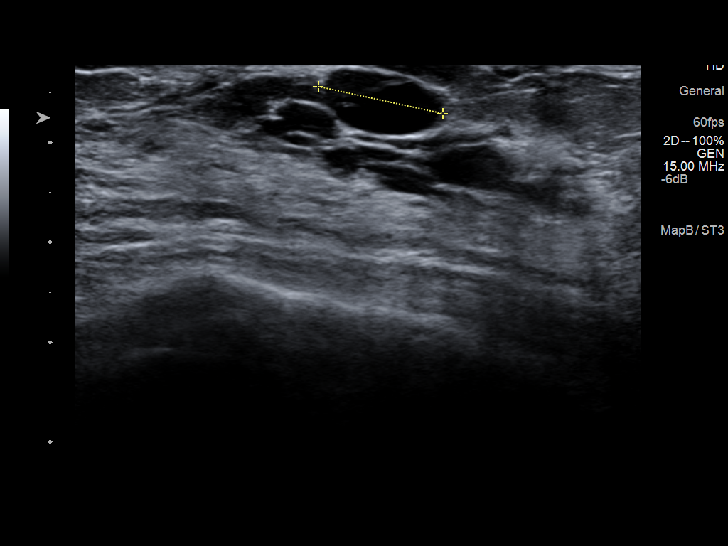
[im 8/13]
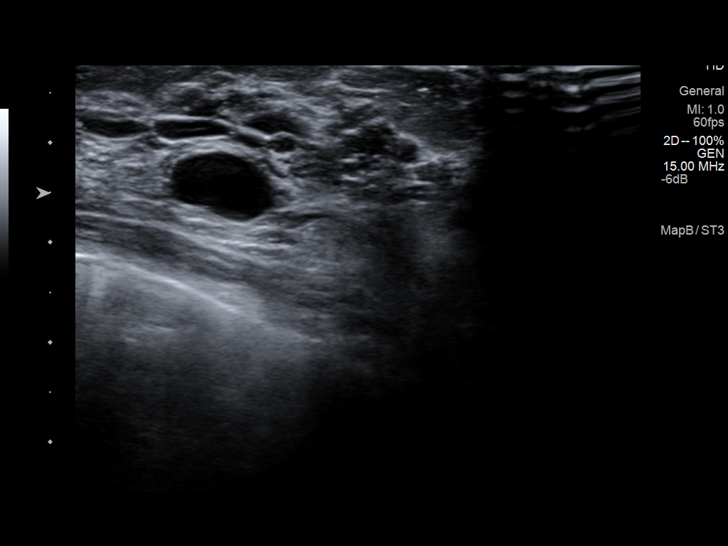
[im 9/13]
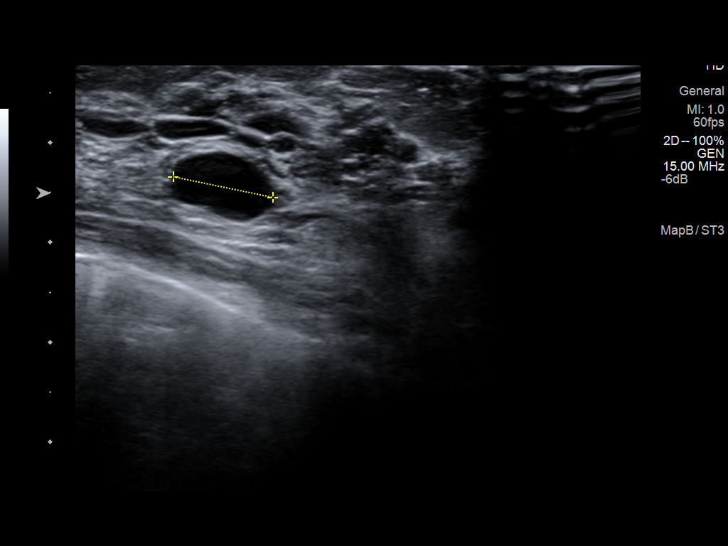
[im 10/13]
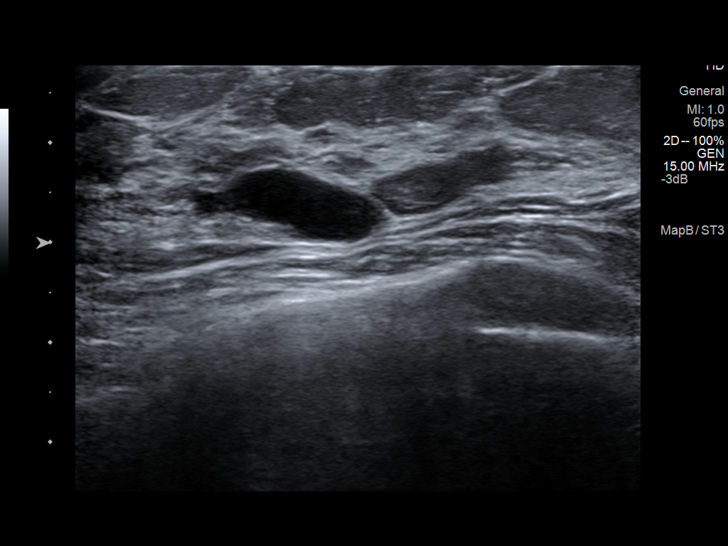
[im 11/13]
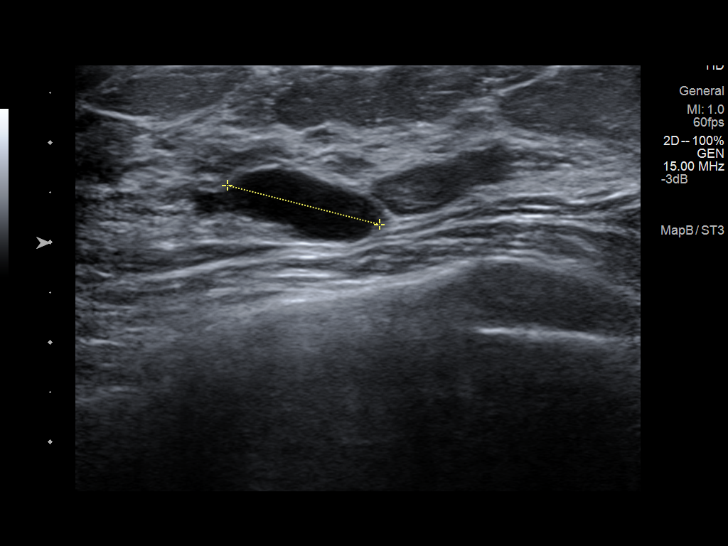
[im 12/13]
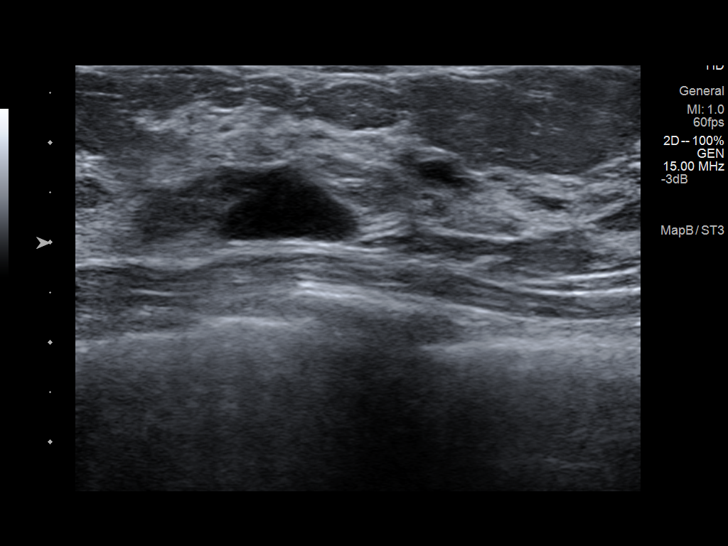
[im 13/13]
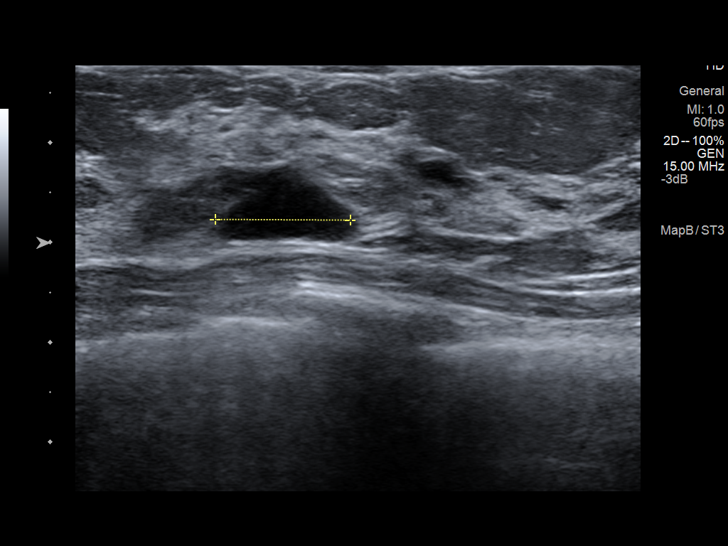

[13 of 13 positions shown; findings below may reference images not displayed]

ACR Breast Density Category c: The breast tissue is heterogeneously
dense, which may obscure small masses.
FINDINGS: There is an oval, partially obscured mass located within the
subareolar portion of the left breast at 4 o'clock position which
corresponds to the palpable finding. In addition, there is a
partially obscured oval mass within the medial posterior subareolar
portion of the right breast. There is no distortion or worrisome
calcification within either breast.

Mammographic images were processed with CAD.

On physical exam, there is a freely mobile, palpable mass located
within the left breast 4 o'clock position 3 cm from the nipple which
by PHYSICAL EXAMINATION measures approximately 1.5 cm in size..

Ultrasound is performed, showing a simple cyst located within the
left breast 4 o'clock position 3 cm from the nipple measuring 1.6 cm
in size corresponding to the palpable finding. There are numerous
bilateral small simple cysts present. There is a simple cyst located
within the right breast at the 3 o'clock position 1 cm from the
nipple posteriorly located measuring 1.6 cm in size and
corresponding to the mammographic finding in this region. There is
no mass, distortion, or worrisome shadowing bilaterally. There are
mildly prominent subareolar ducts present bilaterally..
IMPRESSION: Multiple bilateral simple cysts. The palpable finding corresponds to
a 1.6 cm simple cyst located within the left breast 4 o'clock
position. Breast self-examination was reviewed with the patient.
Recommend bilateral screening mammography at age 40.

RECOMMENDATION:
Bilateral screening mammography at age 40.

I have discussed the findings and recommendations with the patient.
Results were also provided in writing at the conclusion of the
visit.

BI-RADS CATEGORY  2: Benign Finding(s)

## 2014-01-30 IMAGING — MG MM DIGITAL DIAGNOSTIC BILAT
8 of 9 series · 8 of 9 positions shown · non-contrast
Comparison: None.

CLINICAL DATA: Palpable left breast. History fibrocystic breast
disease. There is no family history of breast cancer.

EXAM:
DIGITAL DIAGNOSTIC  bilateral MAMMOGRAM WITH CAD
ULTRASOUND bilateral BREAST

[L CC]
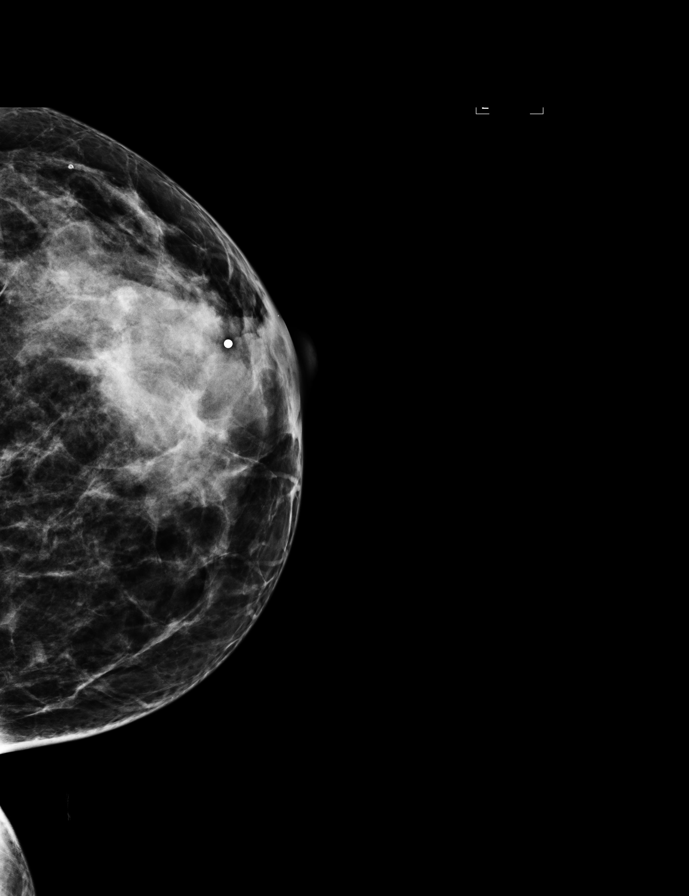

[L MLO (1 of 2)]
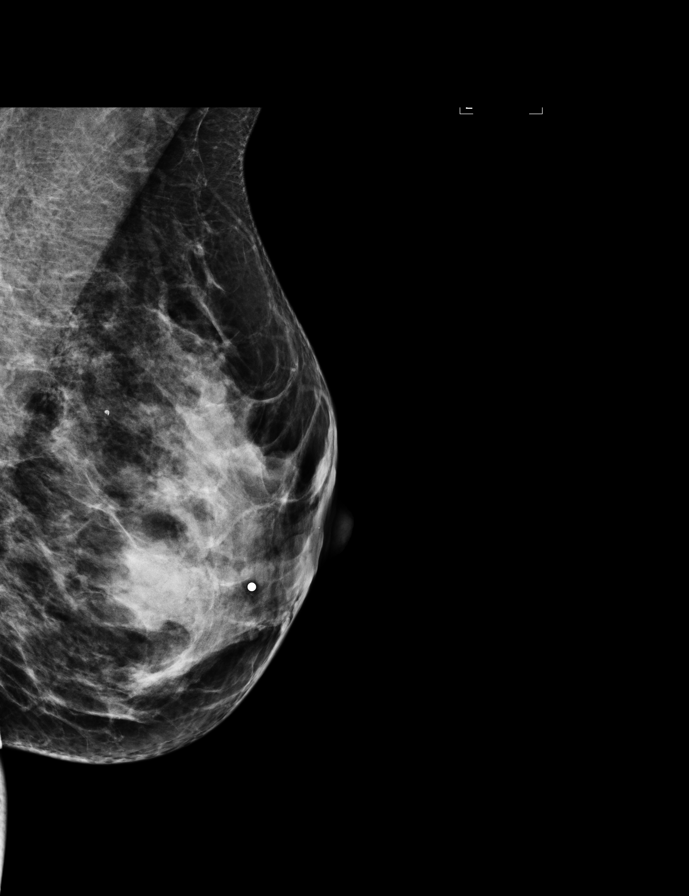

[R CC (1 of 2)]
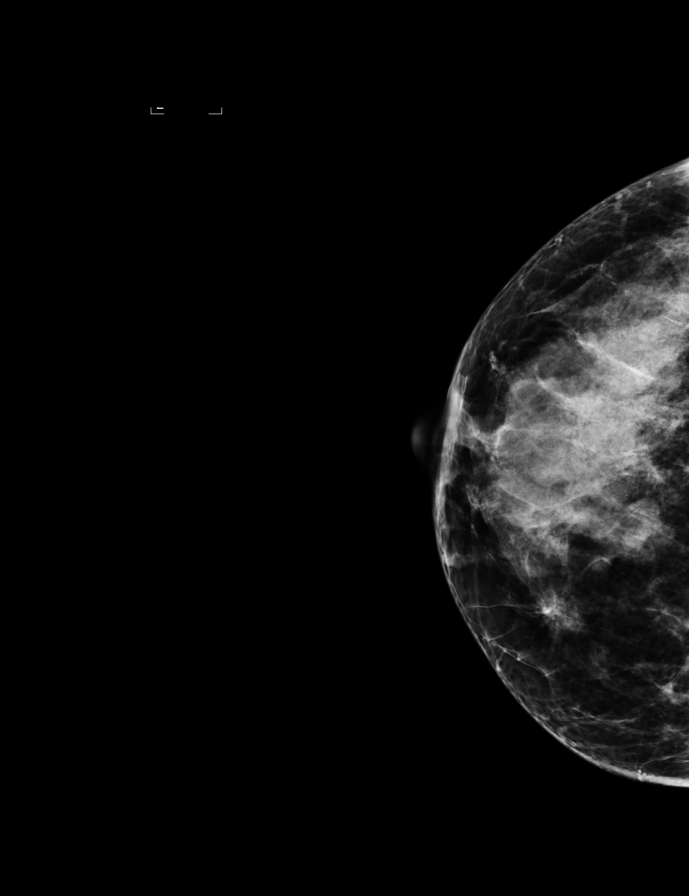

[R MLO]
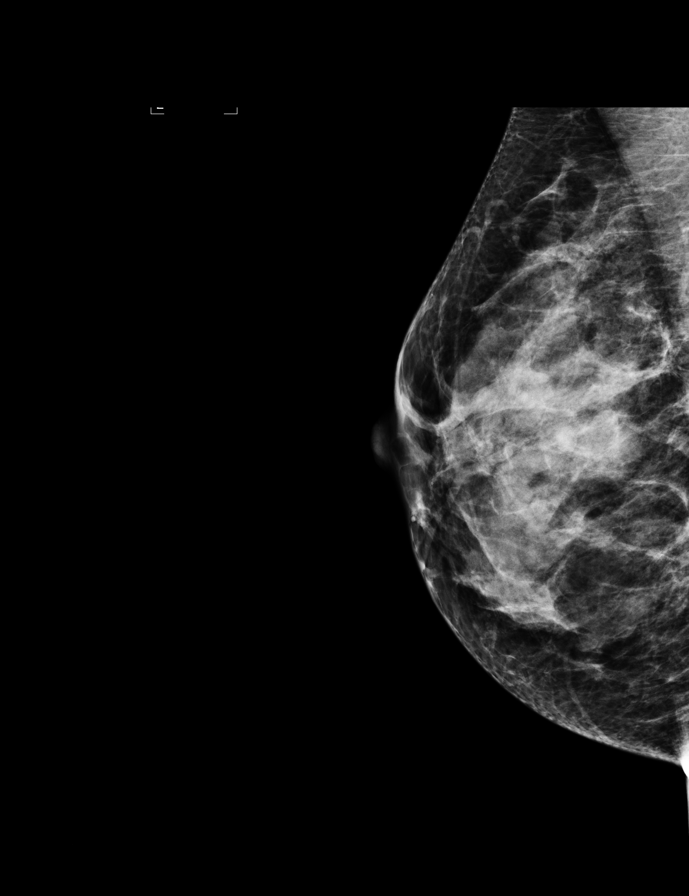

[L TAN (1 of 2)]
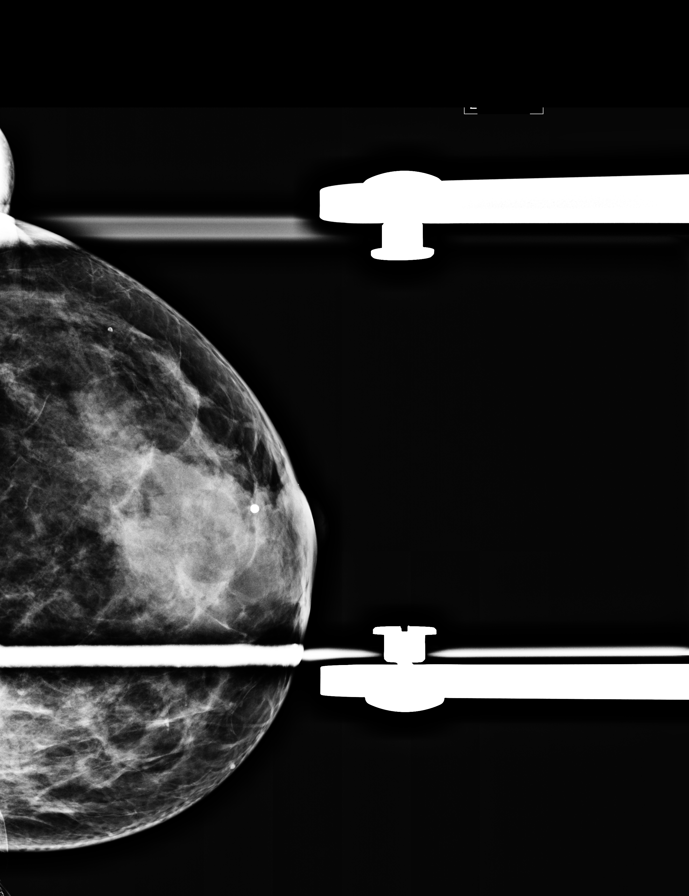

[L TAN (2 of 2)]
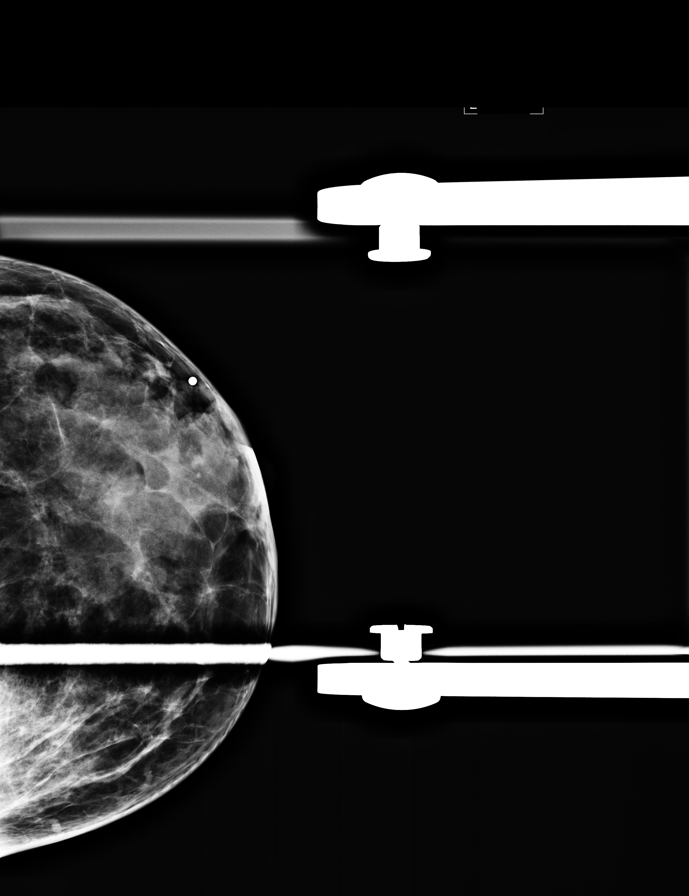

[L MLO (2 of 2)]
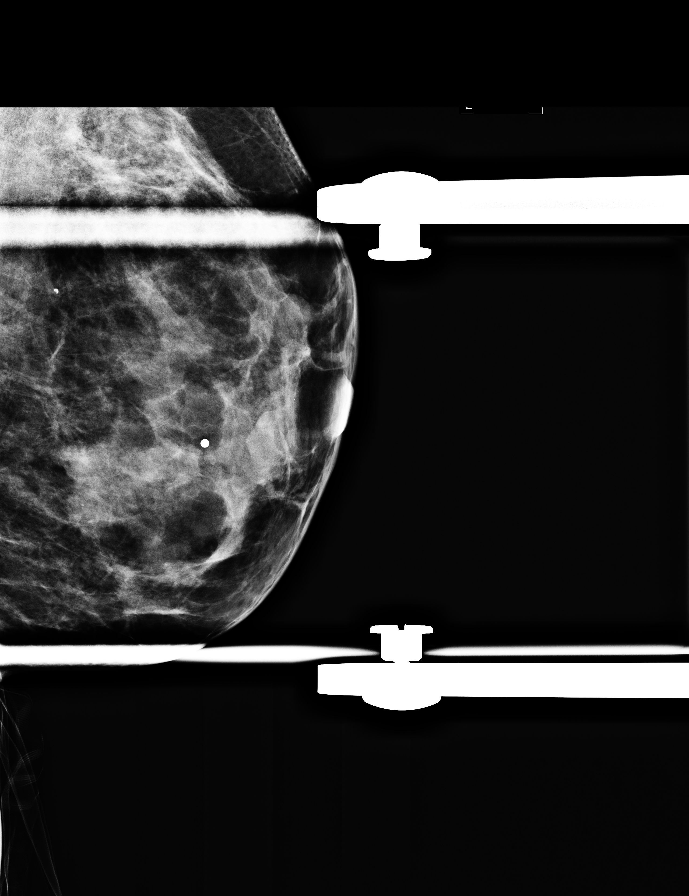

[R CC (2 of 2)]
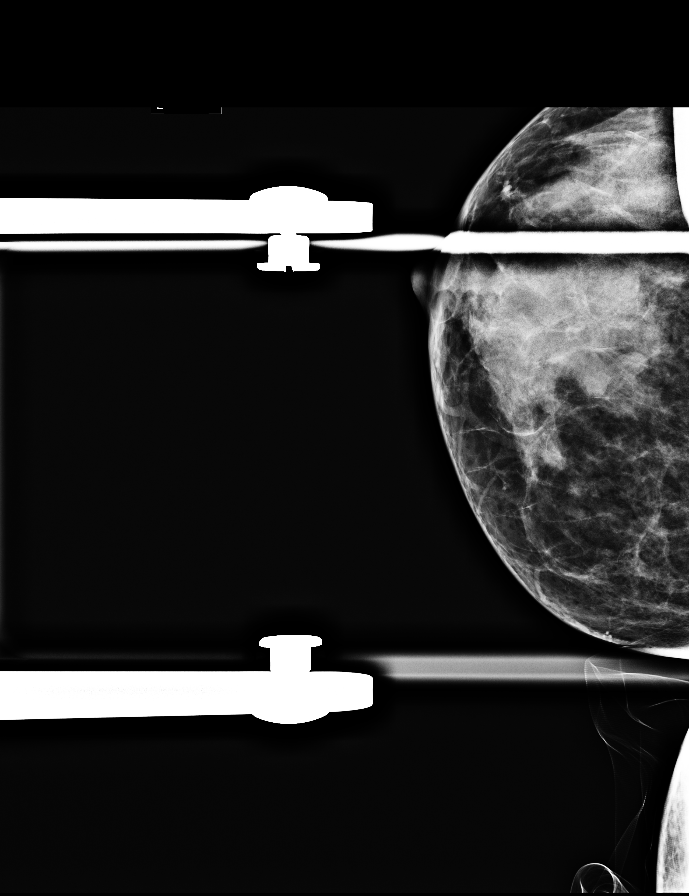

[8 of 9 positions shown; findings below may reference images not displayed]

ACR Breast Density Category c: The breast tissue is heterogeneously
dense, which may obscure small masses.
FINDINGS: There is an oval, partially obscured mass located within the
subareolar portion of the left breast at 4 o'clock position which
corresponds to the palpable finding. In addition, there is a
partially obscured oval mass within the medial posterior subareolar
portion of the right breast. There is no distortion or worrisome
calcification within either breast.

Mammographic images were processed with CAD.

On physical exam, there is a freely mobile, palpable mass located
within the left breast 4 o'clock position 3 cm from the nipple which
by PHYSICAL EXAMINATION measures approximately 1.5 cm in size..

Ultrasound is performed, showing a simple cyst located within the
left breast 4 o'clock position 3 cm from the nipple measuring 1.6 cm
in size corresponding to the palpable finding. There are numerous
bilateral small simple cysts present. There is a simple cyst located
within the right breast at the 3 o'clock position 1 cm from the
nipple posteriorly located measuring 1.6 cm in size and
corresponding to the mammographic finding in this region. There is
no mass, distortion, or worrisome shadowing bilaterally. There are
mildly prominent subareolar ducts present bilaterally..
IMPRESSION: Multiple bilateral simple cysts. The palpable finding corresponds to
a 1.6 cm simple cyst located within the left breast 4 o'clock
position. Breast self-examination was reviewed with the patient.
Recommend bilateral screening mammography at age 40.

RECOMMENDATION:
Bilateral screening mammography at age 41.

I have discussed the findings and recommendations with the patient.
Results were also provided in writing at the conclusion of the
visit.

BI-RADS CATEGORY  2: Benign Finding(s)

## 2014-02-01 ENCOUNTER — Ambulatory Visit (INDEPENDENT_AMBULATORY_CARE_PROVIDER_SITE_OTHER): Payer: 59 | Admitting: Physician Assistant

## 2014-02-01 ENCOUNTER — Encounter: Payer: Self-pay | Admitting: Physician Assistant

## 2014-02-01 VITALS — BP 120/68 | HR 68 | Temp 99.4°F | Resp 18 | Ht 67.0 in | Wt 159.0 lb

## 2014-02-01 DIAGNOSIS — J029 Acute pharyngitis, unspecified: Secondary | ICD-10-CM

## 2014-02-01 LAB — RAPID STREP SCREEN (MED CTR MEBANE ONLY): STREPTOCOCCUS, GROUP A SCREEN (DIRECT): NEGATIVE

## 2014-02-01 NOTE — Progress Notes (Signed)
    Patient ID: Arnell AsalSharon Nickless MRN: [id number], DOB: [birth year], 38 y.o. Date of Encounter: 02/01/2014, 2:47 PM    Chief Complaint:  Chief Complaint  Patient presents with  . Sore Throat     HPI: 38 y.o. year old white female states that yesterday she developed sore throat. Also yesterday had diffuse body ache. This morning fever was 101.5. She has taken no Tylenol or Motrin or other antipyretic. She feels a lump of phlegm in her throat. However has no chest congestion or cough. Has had no mucus from her nose no rhinorrhea or sneezing. No ear pain. No known sick contacts.     Home Meds:   Outpatient Prescriptions Prior to Visit  Medication Sig Dispense Refill  . Linaclotide (LINZESS) 290 MCG CAPS capsule Take 1 capsule (290 mcg total) by mouth daily.  90 capsule  3   No facility-administered medications prior to visit.    Allergies: No Known Allergies    Review of Systems: See HPI for pertinent ROS. All other ROS negative.    Physical Exam: Blood pressure 120/68, pulse 68, temperature 99.4 F (37.4 C), resp. rate 18, height 5\' 7"  (1.702 m), weight 159 lb (72.122 kg)., Body mass index is 24.9 kg/(m^2). General:  WNWD WF. Appears in no acute distress. HEENT: Normocephalic, atraumatic, eyes without discharge, sclera non-icteric, nares are without discharge. Bilateral auditory canals clear, TM's are without perforation, pearly grey and translucent with reflective cone of light bilaterally. Oral cavity moist, posterior pharynx without exudate, erythema, peritonsillar abscess. There is no erythema. No exudate.   Neck: Supple. No thyromegaly. No lymphadenopathy. No lymph nodes are enlarged and no lymph nodes are tender. Lungs: Clear bilaterally to auscultation without wheezes, rales, or rhonchi. Breathing is unlabored. Heart: Regular rhythm. No murmurs, rubs, or gallops. Msk:  Strength and tone normal for age. Extremities/Skin: Warm and dry.  No rashes. Neuro: Alert and oriented X 3.  Moves all extremities spontaneously. Gait is normal. CNII-XII grossly in tact. Psych:  Responds to questions appropriately with a normal affect.      ASSESSMENT AND PLAN:  38 y.o. year old female with  1. Viral pharyngitis - Rapid strep screen--Negative  Told her to use lozenges or spray Tylenol or Motrin as needed to control the sore throat pain. Told her to follow up if fever increases or if pain worsens significantly or she develops any new signs or symptoms. Also followup if sore throat persists 7 days.   Murray HodgkinsSigned, Samule Life Beth Den[place], [place], [facility] 02/01/2014 2:47 PM

## 2014-09-19 ENCOUNTER — Other Ambulatory Visit: Payer: PRIVATE HEALTH INSURANCE

## 2014-09-19 DIAGNOSIS — Z79899 Other long term (current) drug therapy: Secondary | ICD-10-CM

## 2014-09-19 DIAGNOSIS — E559 Vitamin D deficiency, unspecified: Secondary | ICD-10-CM

## 2014-09-19 DIAGNOSIS — Z0000 Encounter for general adult medical examination without abnormal findings: Secondary | ICD-10-CM

## 2014-09-19 DIAGNOSIS — K900 Celiac disease: Secondary | ICD-10-CM

## 2014-09-19 LAB — LIPID PANEL
Cholesterol: 185 mg/dL (ref 0–200)
HDL: 50 mg/dL (ref >=46)
LDL Cholesterol: 114 mg/dL — ABNORMAL HIGH (ref 0–99)
Total CHOL/HDL Ratio: 3.7 Ratio
Triglycerides: 104 mg/dL (ref <150)
VLDL: 21 mg/dL (ref 0–40)

## 2014-09-19 LAB — CBC WITH DIFFERENTIAL/PLATELET
Basophils Absolute: 0.1 10*3/uL (ref 0.0–0.1)
Basophils Relative: 1 % (ref 0–1)
Eosinophils Absolute: 0.3 10*3/uL (ref 0.0–0.7)
Eosinophils Relative: 4 % (ref 0–5)
HCT: 43.7 % (ref 36.0–46.0)
HEMOGLOBIN: 14.4 g/dL (ref 12.0–15.0)
LYMPHS ABS: 1.6 10*3/uL (ref 0.7–4.0)
Lymphocytes Relative: 22 % (ref 12–46)
MCH: 27.4 pg (ref 26.0–34.0)
MCHC: 33 g/dL (ref 30.0–36.0)
MCV: 83.2 fL (ref 78.0–100.0)
MONO ABS: 0.5 10*3/uL (ref 0.1–1.0)
MPV: 12 fL (ref 8.6–12.4)
Monocytes Relative: 7 % (ref 3–12)
NEUTROS PCT: 66 % (ref 43–77)
Neutro Abs: 4.8 10*3/uL (ref 1.7–7.7)
Platelets: 180 10*3/uL (ref 150–400)
RBC: 5.25 MIL/uL — ABNORMAL HIGH (ref 3.87–5.11)
RDW: 14.2 % (ref 11.5–15.5)
WBC: 7.3 10*3/uL (ref 4.0–10.5)

## 2014-09-19 LAB — COMPLETE METABOLIC PANEL WITH GFR
ALBUMIN: 4.2 g/dL (ref 3.5–5.2)
ALK PHOS: 75 U/L (ref 39–117)
ALT: 22 U/L (ref 0–35)
AST: 19 U/L (ref 0–37)
BILIRUBIN TOTAL: 0.4 mg/dL (ref 0.2–1.2)
BUN: 12 mg/dL (ref 6–23)
CALCIUM: 9.4 mg/dL (ref 8.4–10.5)
CHLORIDE: 104 meq/L (ref 96–112)
CO2: 24 mEq/L (ref 19–32)
CREATININE: 0.79 mg/dL (ref 0.50–1.10)
GFR, Est African American: >89 mL/min
GFR, Est Non African American: >89 mL/min
GLUCOSE: 81 mg/dL (ref 70–99)
Potassium: 4.2 mEq/L (ref 3.5–5.3)
Sodium: 140 mEq/L (ref 135–145)
Total Protein: 7.4 g/dL (ref 6.0–8.3)

## 2014-09-19 LAB — TSH: TSH: 3.635 u[IU]/mL (ref 0.350–4.500)

## 2014-09-20 LAB — VITAMIN D 25 HYDROXY (VIT D DEFICIENCY, FRACTURES): Vit D, 25-Hydroxy: 17 ng/mL — ABNORMAL LOW (ref 30–100)

## 2014-09-21 ENCOUNTER — Encounter: Payer: Self-pay | Admitting: Family Medicine

## 2014-09-21 ENCOUNTER — Other Ambulatory Visit: Payer: Self-pay

## 2014-09-24 ENCOUNTER — Encounter: Payer: Self-pay | Admitting: Family Medicine

## 2014-09-24 ENCOUNTER — Ambulatory Visit (INDEPENDENT_AMBULATORY_CARE_PROVIDER_SITE_OTHER): Payer: PRIVATE HEALTH INSURANCE | Admitting: Family Medicine

## 2014-09-24 ENCOUNTER — Other Ambulatory Visit: Payer: Self-pay

## 2014-09-24 VITALS — BP 100/62 | HR 64 | Temp 98.4°F | Resp 14 | Ht 67.0 in | Wt 168.0 lb

## 2014-09-24 DIAGNOSIS — Z0000 Encounter for general adult medical examination without abnormal findings: Secondary | ICD-10-CM | POA: Diagnosis not present

## 2014-09-24 MED ORDER — LINACLOTIDE 290 MCG PO CAPS: 290.0000 ug | ORAL_CAPSULE | Freq: Every day | ORAL | Status: DC

## 2014-09-24 NOTE — Progress Notes (Signed)
Subjective:    Patient ID: Deanna Farrell, female    DOB: 1975/10/18, 39 y.o.   MRN: [id number]  HPI Patient is here today for complete physical exam. She has no concerns. Patient had a colonoscopy 2 years ago which was significant only for hemorrhoids. She had a mammogram in 2015 which was normal. She does have a history of an abnormal Pap smear. Patient has seen gynecology who performed a colposcopy. Currently she is having Pap smears annually to monitor for resolution of her cervical dysplasia. Her most recent lab work as listed below: Lab on 09/19/2014  Component Date Value Ref Range Status  . Vit D, 25-Hydroxy 09/19/2014 17* 30 - 100 ng/mL Final   Comment: Vitamin D Status           25-OH Vitamin D        Deficiency                <20 ng/mL        Insufficiency         20 - 29 ng/mL        Optimal             > or = 30 ng/mL   For 25-OH Vitamin D testing on patients on D2-supplementation and patients for whom quantitation of D2 and D3 fractions is required, the [facility] 25-OH VIT D, (D2,D3), LC/MS/MS is recommended: order code [id number] (patients > 2 yrs).   . WBC 09/19/2014 7.3  4.0 - 10.5 K/uL Final  . RBC 09/19/2014 5.25* 3.87 - 5.11 MIL/uL Final  . Hemoglobin 09/19/2014 14.4  12.0 - 15.0 g/dL Final  . HCT 09/19/2014 43.7  36.0 - 46.0 % Final  . MCV 09/19/2014 83.2  78.0 - 100.0 fL Final  . MCH 09/19/2014 27.4  26.0 - 34.0 pg Final  . MCHC 09/19/2014 33.0  30.0 - 36.0 g/dL Final  . RDW 09/19/2014 14.2  11.5 - 15.5 % Final  . Platelets 09/19/2014 180  150 - 400 K/uL Final  . MPV 09/19/2014 12.0  8.6 - 12.4 fL Final  . Neutrophils Relative % 09/19/2014 66  43 - 77 % Final  . Neutro Abs 09/19/2014 4.8  1.7 - 7.7 K/uL Final  . Lymphocytes Relative 09/19/2014 22  12 - 46 % Final  . Lymphs Abs 09/19/2014 1.6  0.7 - 4.0 K/uL Final  . Monocytes Relative 09/19/2014 7  3 - 12 % Final  . Monocytes Absolute 09/19/2014 0.5  0.1 - 1.0 K/uL Final  . Eosinophils Relative 09/19/2014 4  0 -  5 % Final  . Eosinophils Absolute 09/19/2014 0.3  0.0 - 0.7 K/uL Final  . Basophils Relative 09/19/2014 1  0 - 1 % Final  . Basophils Absolute 09/19/2014 0.1  0.0 - 0.1 K/uL Final  . Smear Review 09/19/2014 Criteria for review not met   Final  . Cholesterol 09/19/2014 185  0 - 200 mg/dL Final   Comment: ATP III Classification:       < 200        mg/dL        Desirable      200 - 239     mg/dL        Borderline High      >= 240        mg/dL        High     . Triglycerides 09/19/2014 104  <150 mg/dL Final  . HDL 09/19/2014 50  >=46 mg/dL Final   **  Please note change in reference range(s). **  . Total CHOL/HDL Ratio 09/19/2014 3.7   Final  . VLDL 09/19/2014 21  0 - 40 mg/dL Final  . LDL Cholesterol 09/19/2014 114* 0 - 99 mg/dL Final   Comment:   Total Cholesterol/HDL Ratio:CHD Risk                        Coronary Heart Disease Risk Table                                        Men       Women          1/2 Average Risk              3.4        3.3              Average Risk              5.0        4.4           2X Average Risk              9.6        7.1           3X Average Risk             23.4       11.0 Use the calculated Patient Ratio above and the CHD Risk table  to determine the patient's CHD Risk. ATP III Classification (LDL):       < 100        mg/dL         Optimal      100 - 129     mg/dL         Near or Above Optimal      130 - 159     mg/dL         Borderline High      160 - 189     mg/dL         High       > 190        mg/dL         Very High     . TSH 09/19/2014 3.635  0.350 - 4.500 uIU/mL Final  . Sodium 09/19/2014 140  135 - 145 mEq/L Final  . Potassium 09/19/2014 4.2  3.5 - 5.3 mEq/L Final  . Chloride 09/19/2014 104  96 - 112 mEq/L Final  . CO2 09/19/2014 24  19 - 32 mEq/L Final  . Glucose, Bld 09/19/2014 81  70 - 99 mg/dL Final  . BUN 09/19/2014 12  6 - 23 mg/dL Final  . Creat 09/19/2014 0.79  0.50 - 1.10 mg/dL Final  . Total Bilirubin 09/19/2014 0.4  0.2 -  1.2 mg/dL Final  . Alkaline Phosphatase 09/19/2014 75  39 - 117 U/L Final  . AST 09/19/2014 19  0 - 37 U/L Final  . ALT 09/19/2014 22  0 - 35 U/L Final  . Total Protein 09/19/2014 7.4  6.0 - 8.3 g/dL Final  . Albumin 09/19/2014 4.2  3.5 - 5.2 g/dL Final  . Calcium 09/19/2014 9.4  8.4 - 10.5 mg/dL Final  . GFR, Est African American 09/19/2014 >89   Final  . GFR, Est Non African American 09/19/2014 >89  Final   Comment:   The estimated GFR is a calculation valid for adults (>=38 years old) that uses the CKD-EPI algorithm to adjust for age and sex. It is   not to be used for children, pregnant women, hospitalized patients,    patients on dialysis, or with rapidly changing kidney function. According to the NKDEP, eGFR >89 is normal, 60-89 shows mild impairment, 30-59 shows moderate impairment, 15-29 shows severe impairment and <15 is ESRD.      Past Medical History  Diagnosis Date  . SVT (supraventricular tachycardia) 1999  . Chronic constipation    Past Surgical History  Procedure Laterality Date  . Tonsillectomy  2001  . Tubal ligation    . Colonoscopy N/A 05/29/2013    TDV:VOHYWV mucosa in the terminal ileum/The colon was redundant/Moderate sized internal and external hemorrhoids   No current outpatient prescriptions on file prior to visit.   No current facility-administered medications on file prior to visit.   No Known Allergies History   Social History  . Marital Status: Divorced    Spouse Name: N/A  . Number of Children: N/A  . Years of Education: N/A   Occupational History  . [employer]    Social History Main Topics  . Smoking status: Never Smoker   . Smokeless tobacco: Never Used  . Alcohol Use: No  . Drug Use: No  . Sexual Activity: Yes    Birth Control/ Protection: Surgical   Other Topics Concern  . Not on file   Social History Narrative   Single.    3 children. Ages 67, 33,19 . 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    Children with her full time.    Shipping/Receiving Clerk at [employer]   Family History  Problem Relation Age of Onset  . Diabetes Mother   . Hypertension Father   . Hypertension Sister   . Diabetes Maternal Grandmother   . Cancer Maternal Grandmother 24    Lung Cancer--smoker  . Diabetes Paternal Grandmother   . Cancer Paternal Grandmother 87    Breast Cancer  . Stroke Paternal Grandmother   . Heart disease Paternal Grandmother   . Colon cancer Neg Hx       Review of Systems  All other systems reviewed and are negative.      Objective:   Physical Exam  Constitutional: She is oriented to person, place, and time. She appears well-developed and well-nourished. No distress.  HENT:  Head: Normocephalic and atraumatic.  Right Ear: External ear normal.  Left Ear: External ear normal.  Nose: Nose normal.  Mouth/Throat: Oropharynx is clear and moist. No oropharyngeal exudate.  Eyes: Conjunctivae and EOM are normal. Pupils are equal, round, and reactive to light. Right eye exhibits no discharge. Left eye exhibits no discharge. No scleral icterus.  Neck: Normal range of motion. Neck supple. No JVD present. No tracheal deviation present. No thyromegaly present.  Cardiovascular: Normal rate, regular rhythm, normal heart sounds and intact distal pulses.  Exam reveals no gallop and no friction rub.   No murmur heard. Pulmonary/Chest: Effort normal and breath sounds normal. No stridor. No respiratory distress. She has no wheezes. She has no rales. She exhibits no tenderness.  Abdominal: Soft. Bowel sounds are normal. She exhibits no distension and no mass. There is no tenderness. There is no rebound.  Musculoskeletal: Normal range of motion. She exhibits no edema or tenderness.  Lymphadenopathy:    She has no cervical adenopathy.  Neurological: She is alert and oriented to person, place, and  time. She has normal reflexes. She displays normal reflexes. No cranial nerve deficit. She exhibits normal muscle tone.  Coordination normal.  Skin: Skin is warm. No rash noted. She is not diaphoretic. No erythema. No pallor.  Psychiatric: She has a normal mood and affect. Her behavior is normal. Judgment and thought content normal.  Vitals reviewed.         Assessment & Plan:  Routine general medical examination at a health care facility  Patient's physical exam today is completely normal. Her lab work is only significant for vitamin D deficiency. I recommended replacing vitamin D with vitamin D3 50,000 units 1 by mouth every week for 6 months. After completion of this prescription, I would like the patient to take 1000 units of vitamin D every day indefinitely. Continue calcium 1000 mg by mouth daily. Otherwise cancer screening is up-to-date. Immunizations are up-to-date. Regular anticipatory guidance is provided.

## 2014-09-25 MED ORDER — LINACLOTIDE 290 MCG PO CAPS: 290.0000 ug | ORAL_CAPSULE | Freq: Every day | ORAL | Status: DC

## 2014-10-11 ENCOUNTER — Encounter: Payer: PRIVATE HEALTH INSURANCE | Admitting: Obstetrics & Gynecology

## 2014-10-23 ENCOUNTER — Ambulatory Visit (INDEPENDENT_AMBULATORY_CARE_PROVIDER_SITE_OTHER): Payer: PRIVATE HEALTH INSURANCE | Admitting: Obstetrics & Gynecology

## 2014-10-23 ENCOUNTER — Encounter: Payer: Self-pay | Admitting: Obstetrics & Gynecology

## 2014-10-23 ENCOUNTER — Other Ambulatory Visit (HOSPITAL_COMMUNITY)
Admission: RE | Admit: 2014-10-23 | Discharge: 2014-10-23 | Disposition: A | Discharge status: Home / Self Care | Payer: 59 | Source: Ambulatory Visit | Attending: Obstetrics & Gynecology | Admitting: Obstetrics & Gynecology

## 2014-10-23 VITALS — BP 100/60 | HR 68 | Ht 68.0 in | Wt 167.0 lb

## 2014-10-23 DIAGNOSIS — Z1151 Encounter for screening for human papillomavirus (HPV): Secondary | ICD-10-CM | POA: Insufficient documentation

## 2014-10-23 DIAGNOSIS — Z01411 Encounter for gynecological examination (general) (routine) with abnormal findings: Secondary | ICD-10-CM | POA: Diagnosis not present

## 2014-10-23 DIAGNOSIS — N870 Mild cervical dysplasia: Secondary | ICD-10-CM | POA: Diagnosis not present

## 2014-10-23 DIAGNOSIS — R87810 Cervical high risk human papillomavirus (HPV) DNA test positive: Secondary | ICD-10-CM | POA: Diagnosis present

## 2014-10-23 NOTE — Addendum Note (Signed)
Addended by: Richardson ChiquitoRAVIS, ASHLEY M on: 10/23/2014 03:50 PM   Modules accepted: Orders

## 2014-10-23 NOTE — Progress Notes (Signed)
Patient ID: Deanna AsalSharon Farrell, female   DOB: [birth year], 39 y.o.   MRN: [id number] Chief Complaint  Patient presents with  . 6 month follow-up    colpo.   Blood pressure 100/60, pulse 68, height 5\' 8"  (1.727 m), weight 167 lb (75.751 kg), last menstrual period 10/09/2014.  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  The summary and outline of the counseling and care coordination is summarized in the note above.   All questions were answered.

## 2014-10-25 LAB — CYTOLOGY - PAP

## 2014-11-08 ENCOUNTER — Telehealth: Payer: Self-pay | Admitting: Obstetrics & Gynecology

## 2014-11-08 NOTE — Telephone Encounter (Signed)
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 Pt to repeat pap in 1 year. Pt verbalized understanding.

## 2014-11-13 ENCOUNTER — Encounter: Payer: Self-pay | Admitting: Family Medicine

## 2014-11-13 ENCOUNTER — Ambulatory Visit (INDEPENDENT_AMBULATORY_CARE_PROVIDER_SITE_OTHER): Payer: PRIVATE HEALTH INSURANCE | Admitting: Family Medicine

## 2014-11-13 VITALS — BP 98/62 | HR 94 | Temp 98.8°F | Resp 16 | Wt 163.0 lb

## 2014-11-13 DIAGNOSIS — J029 Acute pharyngitis, unspecified: Secondary | ICD-10-CM | POA: Diagnosis not present

## 2014-11-13 LAB — RAPID STREP SCREEN (MED CTR MEBANE ONLY): Streptococcus, Group A Screen (Direct): NEGATIVE

## 2014-11-13 MED ORDER — AMOXICILLIN 875 MG PO TABS: 875.0000 mg | ORAL_TABLET | Freq: Two times a day (BID) | ORAL | Status: AC

## 2014-11-13 NOTE — Progress Notes (Signed)
Subjective:    Patient ID: Deanna Farrell, female    DOB: [birth year], 39 y.o.   MRN: [id number]  HPI Patient states that I have strep.  She states that this is how she feels every time she gets strep throat. Symptoms began Friday with a severe sore throat. Saturday she developed fever to 101.0. The sore throat worsened. Today she is complaining of body aches, subjective fevers, and persistent sore throat. She denies any rhinorrhea. She denies any cough. She denies any otalgia. She denies any nausea vomiting or diarrhea. She denies any rash. Past Medical History  Diagnosis Date  . SVT (supraventricular tachycardia) 1999  . Chronic constipation    Past Surgical History  Procedure Laterality Date  . Tonsillectomy  2001  . Tubal ligation    . Colonoscopy N/A 05/29/2013    WUJ:WJXBJY mucosa in the terminal ileum/The colon was redundant/Moderate sized internal and external hemorrhoids   Current Outpatient Prescriptions on File Prior to Visit  Medication Sig Dispense Refill  . Magnesium 400 MG CAPS Take by mouth.    . Omega-3 Fatty Acids (FISH OIL CONCENTRATE PO) Take by mouth.    Marland Kitchen POTASSIUM CHLORIDE PO Take by mouth.    . vitamin C (ASCORBIC ACID) 500 MG tablet Take 500 mg by mouth daily.    . vitamin E 400 UNIT capsule Take 400 Units by mouth daily.     No current facility-administered medications on file prior to visit.   Allergies  Allergen Reactions  . Biaxin [Clarithromycin] Nausea And Vomiting   History   Social History  . Marital Status: Divorced    Spouse Name: N/A  . Number of Children: N/A  . Years of Education: N/A   Occupational History  . [employer]    Social History Main Topics  . Smoking status: Never Smoker   . Smokeless tobacco: Never Used  . Alcohol Use: No  . Drug Use: No  . Sexual Activity: Yes    Birth Control/ Protection: Surgical   Other Topics Concern  . Not on file   Social History Narrative   Single.    3 children. Ages 65, 93,66 .  6 year old autistic.    Children with her full time.   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      Objective:   Physical Exam  Constitutional: She appears well-developed and well-nourished. No distress.  HENT:  Head: Normocephalic and atraumatic.  Right Ear: External ear normal.  Left Ear: External ear normal.  Nose: Nose normal.  Mouth/Throat: Oropharynx is clear and moist. No oropharyngeal exudate.  Eyes: Conjunctivae are normal. Pupils are equal, round, and reactive to light. Right eye exhibits no discharge. Left eye exhibits no discharge.  Neck: Neck supple.  Cardiovascular: Normal rate, regular rhythm and normal heart sounds.   No murmur heard. Pulmonary/Chest: Effort normal and breath sounds normal. No respiratory distress. She has no wheezes. She has no rales.  Abdominal: Soft. Bowel sounds are normal. There is no tenderness.  Lymphadenopathy:    She has no cervical adenopathy.  Skin: No rash noted. She is not diaphoretic.  Vitals reviewed.         Assessment & Plan:  Sore throat - Plan: Rapid strep screen  Physical exam, and symptoms are more consistent with viral pharyngitis. However I will perform a strep test.  Strep test is negative. I believe the patient has viral pharyngitis. I have  recommended supportive care including Chloraseptic as needed for sore throat, ibuprofen as needed for sore throat or fevers, and tincture of time. I anticipate gradual improvement over the next 3-4 days. If symptoms suddenly worsen, she develops a purulent exudate in the posterior oropharynx, we can certainly treat the patient for strep throat with amoxicillin 875 mg by mouth twice a day for 10 days. She can call me if the symptoms worsen.
